# Patient Record
Sex: Female | Born: 1990 | Race: White | Hispanic: No | Smoking: Never smoker
Health system: Southern US, Community
[De-identification: ages and names within clinical notes are randomized; demographics above are authoritative.]

## PROBLEM LIST (undated history)

## (undated) DIAGNOSIS — E559 Vitamin D deficiency, unspecified: Secondary | ICD-10-CM

## (undated) DIAGNOSIS — K589 Irritable bowel syndrome without diarrhea: Secondary | ICD-10-CM

## (undated) DIAGNOSIS — F32A Depression, unspecified: Secondary | ICD-10-CM

## (undated) DIAGNOSIS — O24419 Gestational diabetes mellitus in pregnancy, unspecified control: Secondary | ICD-10-CM

## (undated) DIAGNOSIS — D649 Anemia, unspecified: Secondary | ICD-10-CM

## (undated) DIAGNOSIS — I10 Essential (primary) hypertension: Secondary | ICD-10-CM

## (undated) DIAGNOSIS — E282 Polycystic ovarian syndrome: Secondary | ICD-10-CM

## (undated) DIAGNOSIS — Z803 Family history of malignant neoplasm of breast: Secondary | ICD-10-CM

## (undated) DIAGNOSIS — E538 Deficiency of other specified B group vitamins: Secondary | ICD-10-CM

## (undated) DIAGNOSIS — Z8742 Personal history of other diseases of the female genital tract: Secondary | ICD-10-CM

## (undated) HISTORY — DX: Anemia, unspecified: D64.9

## (undated) HISTORY — DX: Gestational diabetes mellitus in pregnancy, unspecified control: O24.419

## (undated) HISTORY — DX: Polycystic ovarian syndrome: E28.2

## (undated) HISTORY — PX: TONSILLECTOMY AND ADENOIDECTOMY: SHX28

## (undated) HISTORY — DX: Family history of malignant neoplasm of breast: Z80.3

## (undated) HISTORY — DX: Vitamin D deficiency, unspecified: E55.9

## (undated) HISTORY — DX: Deficiency of other specified B group vitamins: E53.8

## (undated) HISTORY — DX: Irritable bowel syndrome, unspecified: K58.9

## (undated) HISTORY — DX: Personal history of other diseases of the female genital tract: Z87.42

## (undated) HISTORY — PX: WISDOM TOOTH EXTRACTION: SHX21

## (undated) HISTORY — DX: Depression, unspecified: F32.A

---

## 2017-12-23 DIAGNOSIS — L409 Psoriasis, unspecified: Secondary | ICD-10-CM | POA: Insufficient documentation

## 2019-01-10 DIAGNOSIS — Z2089 Contact with and (suspected) exposure to other communicable diseases: Secondary | ICD-10-CM | POA: Diagnosis not present

## 2019-01-10 DIAGNOSIS — J069 Acute upper respiratory infection, unspecified: Secondary | ICD-10-CM | POA: Diagnosis not present

## 2019-07-20 ENCOUNTER — Other Ambulatory Visit: Payer: Self-pay

## 2019-07-20 ENCOUNTER — Other Ambulatory Visit (HOSPITAL_COMMUNITY)
Admission: RE | Admit: 2019-07-20 | Discharge: 2019-07-20 | Disposition: A | Discharge status: Home / Self Care | Payer: BC Managed Care – PPO | Source: Ambulatory Visit | Attending: Obstetrics and Gynecology | Admitting: Obstetrics and Gynecology

## 2019-07-20 ENCOUNTER — Ambulatory Visit (INDEPENDENT_AMBULATORY_CARE_PROVIDER_SITE_OTHER): Payer: BC Managed Care – PPO | Admitting: Obstetrics and Gynecology

## 2019-07-20 ENCOUNTER — Encounter: Payer: Self-pay | Admitting: Obstetrics and Gynecology

## 2019-07-20 VITALS — BP 114/70 | HR 68 | Ht 66.0 in | Wt 209.0 lb

## 2019-07-20 DIAGNOSIS — E282 Polycystic ovarian syndrome: Secondary | ICD-10-CM | POA: Diagnosis not present

## 2019-07-20 DIAGNOSIS — Z0000 Encounter for general adult medical examination without abnormal findings: Secondary | ICD-10-CM

## 2019-07-20 DIAGNOSIS — Z124 Encounter for screening for malignant neoplasm of cervix: Secondary | ICD-10-CM | POA: Insufficient documentation

## 2019-07-20 DIAGNOSIS — Z01419 Encounter for gynecological examination (general) (routine) without abnormal findings: Secondary | ICD-10-CM

## 2019-07-20 DIAGNOSIS — R102 Pelvic and perineal pain: Secondary | ICD-10-CM | POA: Diagnosis not present

## 2019-07-20 DIAGNOSIS — F419 Anxiety disorder, unspecified: Secondary | ICD-10-CM

## 2019-07-20 DIAGNOSIS — F329 Major depressive disorder, single episode, unspecified: Secondary | ICD-10-CM

## 2019-07-20 DIAGNOSIS — Z30431 Encounter for routine checking of intrauterine contraceptive device: Secondary | ICD-10-CM

## 2019-07-20 DIAGNOSIS — Z13220 Encounter for screening for lipoid disorders: Secondary | ICD-10-CM

## 2019-07-20 DIAGNOSIS — Z975 Presence of (intrauterine) contraceptive device: Secondary | ICD-10-CM

## 2019-07-20 DIAGNOSIS — N921 Excessive and frequent menstruation with irregular cycle: Secondary | ICD-10-CM

## 2019-07-20 DIAGNOSIS — Z1329 Encounter for screening for other suspected endocrine disorder: Secondary | ICD-10-CM

## 2019-07-20 DIAGNOSIS — Z30011 Encounter for initial prescription of contraceptive pills: Secondary | ICD-10-CM

## 2019-07-20 DIAGNOSIS — Z130 Encounter for screening for diseases of the blood and blood-forming organs and certain disorders involving the immune mechanism: Secondary | ICD-10-CM

## 2019-07-20 DIAGNOSIS — Z131 Encounter for screening for diabetes mellitus: Secondary | ICD-10-CM

## 2019-07-20 MED ORDER — NORETHIN ACE-ETH ESTRAD-FE 1-20 MG-MCG PO TABS: 1.0000 | ORAL_TABLET | Freq: Every day | ORAL | 11 refills | Status: DC

## 2019-07-20 MED ORDER — ALPRAZOLAM 0.5 MG PO TABS: 0.5000 mg | ORAL_TABLET | Freq: Every evening | ORAL | 5 refills | Status: DC | PRN

## 2019-07-20 MED ORDER — ESCITALOPRAM OXALATE 10 MG PO TABS: 10.0000 mg | ORAL_TABLET | Freq: Every day | ORAL | 3 refills | Status: DC

## 2019-07-20 NOTE — Progress Notes (Signed)
Gynecology Annual Exam   PCP: Patient, No Pcp Per  Chief Complaint:  Chief Complaint  Patient presents with  . Gynecologic Exam    Having pelvic pain all the time.     History of Present Illness: Patient is a 28 y.o. G0P0000 presents for annual exam. The patient has additional complaints today of anxiety, depression, pelvic pain, irregular bleeding, and hx of PCOS  Gynecological History Menarche: 11 LMP: 07/17/2019 Describes periods as irregular. She some times has a week and a half of bleeding every 2 weeks.  Last pap smear: 3 years ago, unsure results, no records available from [place] History of STDs: None Sexually Active: Yes, has some dyspareunia or sharp pain which is positional. Also reports low desire and low lubrication  Obstetrical History G0P0  LMP: Patient's last menstrual period was 07/17/2019 (exact date). Average Interval: irregular, Duration of flow: 10 days Heavy Menses: less with IUD Clots: no Intermenstrual Bleeding: yes Postcoital Bleeding: no Dysmenorrhea: yes  The patient is sexually active. She currently uses IUD for contraception. She has dyspareunia.  The patient does perform self breast exams.  There is notable family history of breast or ovarian cancer in her family.  The patient wears seatbelts: yes.   The patient has regular exercise: no.    The patient reports current symptoms of depression.    Review of Systems: ROS  Past Medical History:  Past Medical History:  Diagnosis Date  . PCOS (polycystic ovarian syndrome)   . Vitamin B12 deficiency   . Vitamin D deficiency     Past Surgical History:  History reviewed. No pertinent surgical history.  Gynecologic History:  Patient's last menstrual period was 07/17/2019 (exact date). Contraception: IUD Last Pap: Results were: unknown   Obstetric History: G0P0000  Family History:  History reviewed. No pertinent family history.  Social History:  Social History   Socioeconomic  History  . Marital status: Married    Spouse name: Not on file  . Number of children: Not on file  . Years of education: Not on file  . Highest education level: Not on file  Occupational History  . Not on file  Social Needs  . Financial resource strain: Not on file  . Food insecurity    Worry: Not on file    Inability: Not on file  . Transportation needs    Medical: Not on file    Non-medical: Not on file  Tobacco Use  . Smoking status: Never Smoker  . Smokeless tobacco: Never Used  Substance and Sexual Activity  . Alcohol use: Yes  . Drug use: Never  . Sexual activity: Yes    Birth control/protection: I.U.D.  Lifestyle  . Physical activity    Days per week: Not on file    Minutes per session: Not on file  . Stress: Not on file  Relationships  . Social Herbalist on phone: Not on file    Gets together: Not on file    Attends religious service: Not on file    Active member of club or organization: Not on file    Attends meetings of clubs or organizations: Not on file    Relationship status: Not on file  . Intimate partner violence    Fear of current or ex partner: Not on file    Emotionally abused: Not on file    Physically abused: Not on file    Forced sexual activity: Not on file  Other Topics Concern  .  Not on file  Social History Narrative  . Not on file    Allergies:  Allergies  Allergen Reactions  . Augmentin [Amoxicillin-Pot Clavulanate] Diarrhea and Nausea And Vomiting    Medications: Prior to Admission medications   Not on File    Physical Exam Vitals: Blood pressure 114/70, pulse 68, height 5\' 6"  (1.676 m), weight 209 lb (94.8 kg), last menstrual period 07/17/2019.  General: NAD HEENT: normocephalic, anicteric Thyroid: no enlargement, no palpable nodules Pulmonary: No increased work of breathing, CTAB Cardiovascular: RRR, distal pulses 2+ Breast: Breast symmetrical, no tenderness, no palpable nodules or masses, no skin or nipple  retraction present, no nipple discharge.  No axillary or supraclavicular lymphadenopathy. Abdomen: NABS, soft, non-tender, non-distended.  Umbilicus without lesions.  No hepatomegaly, splenomegaly or masses palpable. No evidence of hernia  Genitourinary:  External: Normal external female genitalia.  Normal urethral meatus, normal Bartholin's and Skene's glands.    Vagina: Normal vaginal mucosa, no evidence of prolapse.    Cervix: Grossly normal in appearance, no bleeding  Uterus: Non-enlarged, mobile, normal contour.  No CMT  Adnexa: ovaries non-enlarged, no adnexal masses  Rectal: deferred  Lymphatic: no evidence of inguinal lymphadenopathy Extremities: no edema, erythema, or tenderness Neurologic: Grossly intact Psychiatric: mood appropriate, affect full  Female chaperone present for pelvic and breast  portions of the physical exam    Assessment: 28 y.o. G0P0000 routine annual exam  Plan: Problem List Items Addressed This Visit    None    Visit Diagnoses    Health care maintenance    -  Primary   Relevant Orders   CBC   TSH + free T4   Hemoglobin A1c   Lipid panel   IUD check up       Relevant Orders   US PELVIS TRANSVAGINAL NON-OB (TV ONLY)   Pelvic pain       Relevant Orders   US PELVIS TRANSVAGINAL NON-OB (TV ONLY)   PCOS (polycystic ovarian syndrome)       Relevant Orders   Hemoglobin A1c   Lipid panel   Thyroid disorder screen       Relevant Orders   TSH + free T4   Screening cholesterol level       Relevant Orders   Lipid panel   Screening for diabetes mellitus       Relevant Orders   Hemoglobin A1c   Screening for deficiency anemia       Relevant Orders   CBC   Encounter for initial prescription of contraceptive pills       Relevant Medications   norethindrone-ethinyl estradiol (JUNEL FE 1/20) 1-20 MG-MCG tablet   Breakthrough bleeding with IUD       Cervical cancer screening       Relevant Orders   Cytology - PAP   Anxiety and depression        Relevant Medications   escitalopram (LEXAPRO) 10 MG tablet   ALPRAZolam (XANAX) 0.5 MG tablet      2) STI screening  was offered and accepted  2)  ASCCP guidelines and rational discussed.  Patient opts for every 3 years screening interval  3) Contraception - the patient is currently using  IUD.  She is interested in changing to an oral contraceptive pill. She will start an OCP today and see if she is able to remember to take it every day.   4) Routine healthcare maintenance including cholesterol, diabetes screening discussed ordered today.  5) Offered Myrisk testing for family hx  of breast cancer, she is interested and will consider  6) Hx of PCOS, given information so she can learn more  7) Pelvic pain- will follow up for [place] to access pelvis and IUD position  8) Will need to discuss dyspareunia more at next visit  9) Anxiety and depression. She has taken prozac and celexa in the past and did not like these medications. She is willing to try lexapro, reports her mom takes this medication and is happy with it.  She has not been sleeping at night, frequent awakenings and nightmares. Will start xanax before bed.  GAD-7: 12 PHQ-9: 16  10)Counseled on healthy eating and exercise  11) Return in about 4 weeks (around 08/17/2019) for GYN visit and [place].  Adelene Idler MD [facility], [facility] 07/20/2019 5:04 PM

## 2019-07-20 NOTE — Patient Instructions (Signed)
Institute of [place] for Calcium and Vitamin D  Age (yr) Calcium Recommended Dietary Allowance (mg/day) Vitamin D Recommended Dietary Allowance (international units/day)  9-18 1,300 600  19-50 1,000 600  51-70 1,200 600  71 and older 1,200 800  Data from Institute of Medicine. Dietary reference intakes: calcium, vitamin D. [place], [place]: [employer]; 2011.    Woman[employer]: Perfect Your Cycle, Amplify Your Fertility, Supercharge Your Sex Drive, and Become a Power Source   Diet for Polycystic Ovary Syndrome Polycystic ovary syndrome (PCOS) is a disorder of the chemicals (hormones) that regulate a woman's reproductive system, including monthly periods (menstruation). The condition causes important hormones to be out of balance. PCOS can:  Stop your periods or make them irregular.  Cause cysts to develop on your ovaries.  Make it difficult to get pregnant.  Stop your body from responding to the effects of insulin (insulin resistance). Insulin resistance can lead to obesity and diabetes. Changing what you eat can help you manage PCOS and improve your health. Following a balanced diet can help you lose weight and improve the way that your body uses insulin. What are tips for following this plan?  Follow a balanced diet for meals and snacks. Eat breakfast, lunch, dinner, and one or two snacks every day.  Include protein in each meal and snack.  Choose whole grains instead of products that are made with refined flour.  Eat a variety of foods.  Exercise regularly as told by your health care provider. Aim to do 30 or more minutes of exercise on most days of the week.  If you are overweight or obese: ? Pay attention to how many calories you eat. Cutting down on calories can help you lose weight. ? Work with your health care provider or a diet and nutrition specialist (dietitian) to figure out how many calories you need each day. What  foods can I eat?  Fruits Include a variety of colors and types. All fruits are helpful for PCOS. Vegetables Include a variety of colors and types. All vegetables are helpful for PCOS. Grains Whole grains, such as whole wheat. Whole-grain breads, crackers, cereals, and pasta. Unsweetened oatmeal, bulgur, barley, quinoa, and brown rice. Tortillas made from corn or whole-wheat flour. Meats and other proteins Low-fat (lean) proteins, such as fish, chicken, beans, eggs, and tofu. Dairy Low-fat dairy products, such as skim milk, cheese sticks, and yogurt. Beverages Low-fat or fat-free drinks, such as water, low-fat milk, sugar-free drinks, and small amounts of 100% fruit juice. Seasonings and condiments Ketchup. Mustard. Barbecue sauce. Relish. Low-fat or fat-free mayonnaise. Fats and oils Olive oil or canola oil. Walnuts and almonds. The items listed above may not be a complete list of recommended foods and beverages. Contact a dietitian for more options. What foods are not recommended? Foods that are high in calories or fat. Fried foods. Sweets. Products that are made from refined white flour, including white bread, pastries, white rice, and pasta. The items listed above may not be a complete list of foods and beverages to avoid. Contact a dietitian for more information. Summary  PCOS is a hormonal imbalance that affects a woman's reproductive system.  You can help to manage your PCOS by exercising regularly and eating a healthy, varied diet of vegetables, fruit, whole grains, low-fat (lean) protein, and low-fat dairy products.  Changing what you eat can improve the way that your body uses insulin, help your hormones reach normal levels, and help you lose weight. This information  is not intended to replace advice given to you by your health care provider. Make sure you discuss any questions you have with your health care provider. Document Released: 02/05/2016 Document Revised: 02/03/2019  Document Reviewed: 08/18/2017 Elsevier Patient Education  2020 Elsevier Inc.  Polycystic Ovarian Syndrome  Polycystic ovarian syndrome (PCOS) is a common hormonal disorder among women of reproductive age. In most women with PCOS, many small fluid-filled sacs (cysts) grow on the ovaries, and the cysts are not part of a normal menstrual cycle. PCOS can cause problems with your menstrual periods and make it difficult to get pregnant. It can also cause an increased risk of miscarriage with pregnancy. If it is not treated, PCOS can lead to serious health problems, such as diabetes and heart disease. What are the causes? The cause of PCOS is not known, but it may be the result of a combination of certain factors, such as:  Irregular menstrual cycle.  High levels of certain hormones (androgens).  Problems with the hormone that helps to control blood sugar (insulin resistance).  Certain genes. What increases the risk? This condition is more likely to develop in women who have a family history of PCOS. What are the signs or symptoms? Symptoms of PCOS may include:  Multiple ovarian cysts.  Infrequent periods or no periods.  Periods that are too frequent or too heavy.  Unpredictable periods.  Inability to get pregnant (infertility) because of not ovulating.  Increased growth of hair on the face, chest, stomach, back, thumbs, thighs, or toes.  Acne or oily skin. Acne may develop during adulthood, and it may not respond to treatment.  Pelvic pain.  Weight gain or obesity.  Patches of thickened and dark brown or black skin on the neck, arms, breasts, or thighs (acanthosis nigricans).  Excess hair growth on the face, chest, abdomen, or upper thighs (hirsutism). How is this diagnosed? This condition is diagnosed based on:  Your medical history.  A physical exam, including a pelvic exam. Your health care provider may look for areas of increased hair growth on your skin.  Tests, such  as: ? Ultrasound. This may be used to examine the ovaries and the lining of the uterus (endometrium) for cysts. ? Blood tests. These may be used to check levels of sugar (glucose), female hormone (testosterone), and female hormones (estrogen and progesterone) in your blood. How is this treated? There is no cure for PCOS, but treatment can help to manage symptoms and prevent more health problems from developing. Treatment varies depending on:  Your symptoms.  Whether you want to have a baby or whether you need birth control (contraception). Treatment may include nutrition and lifestyle changes along with:  Progesterone hormone to start a menstrual period.  Birth control pills to help you have regular menstrual periods.  Medicines to make you ovulate, if you want to get pregnant.  Medicine to reduce excessive hair growth.  Surgery, in severe cases. This may involve making small holes in one or both of your ovaries. This decreases the amount of testosterone that your body produces. Follow these instructions at home:  Take over-the-counter and prescription medicines only as told by your health care provider.  Follow a healthy meal plan. This can help you reduce the effects of PCOS. ? Eat a healthy diet that includes lean proteins, complex carbohydrates, fresh fruits and vegetables, low-fat dairy products, and healthy fats. Make sure to eat enough fiber.  If you are overweight, lose weight as told by your health care  provider. ? Losing 10% of your body weight may improve symptoms. ? Your health care provider can determine how much weight loss is best for you and can help you lose weight safely.  Keep all follow-up visits as told by your health care provider. This is important. Contact a health care provider if:  Your symptoms do not get better with medicine.  You develop new symptoms. This information is not intended to replace advice given to you by your health care provider. Make sure  you discuss any questions you have with your health care provider. Document Released: 02/07/2005 Document Revised: 09/26/2017 Document Reviewed: 03/31/2016 Elsevier Patient Education  2020 [employer].

## 2019-07-21 LAB — CBC
Hematocrit: 42.1 % (ref 34.0–46.6)
Hemoglobin: 14.6 g/dL (ref 11.1–15.9)
MCH: 32.3 pg (ref 26.6–33.0)
MCHC: 34.7 g/dL (ref 31.5–35.7)
MCV: 93 fL (ref 79–97)
Platelets: 225 10*3/uL (ref 150–450)
RBC: 4.52 x10E6/uL (ref 3.77–5.28)
RDW: 11.7 % (ref 11.7–15.4)
WBC: 5.6 10*3/uL (ref 3.4–10.8)

## 2019-07-21 LAB — HEMOGLOBIN A1C
Est. average glucose Bld gHb Est-mCnc: 94 mg/dL
Hgb A1c MFr Bld: 4.9 % (ref 4.8–5.6)

## 2019-07-21 LAB — TSH+FREE T4
Free T4: 1.28 ng/dL (ref 0.82–1.77)
TSH: 2.37 u[IU]/mL (ref 0.450–4.500)

## 2019-07-21 LAB — LIPID PANEL
Chol/HDL Ratio: 3.2 ratio (ref 0.0–4.4)
Cholesterol, Total: 153 mg/dL (ref 100–199)
HDL: 48 mg/dL (ref >39)
LDL Chol Calc (NIH): 91 mg/dL (ref 0–99)
Triglycerides: 70 mg/dL (ref 0–149)
VLDL Cholesterol Cal: 14 mg/dL (ref 5–40)

## 2019-07-21 NOTE — Progress Notes (Signed)
Called and discussed with patient

## 2019-07-29 DIAGNOSIS — Z1371 Encounter for nonprocreative screening for genetic disease carrier status: Secondary | ICD-10-CM

## 2019-07-29 DIAGNOSIS — Z9189 Other specified personal risk factors, not elsewhere classified: Secondary | ICD-10-CM

## 2019-07-29 HISTORY — DX: Encounter for nonprocreative screening for genetic disease carrier status: Z13.71

## 2019-07-29 HISTORY — DX: Other specified personal risk factors, not elsewhere classified: Z91.89

## 2019-08-17 ENCOUNTER — Encounter: Payer: Self-pay | Admitting: Obstetrics and Gynecology

## 2019-08-17 ENCOUNTER — Ambulatory Visit (INDEPENDENT_AMBULATORY_CARE_PROVIDER_SITE_OTHER): Payer: BC Managed Care – PPO | Admitting: Obstetrics and Gynecology

## 2019-08-17 ENCOUNTER — Ambulatory Visit (INDEPENDENT_AMBULATORY_CARE_PROVIDER_SITE_OTHER): Payer: BC Managed Care – PPO

## 2019-08-17 ENCOUNTER — Other Ambulatory Visit: Payer: Self-pay

## 2019-08-17 VITALS — BP 120/80 | Ht 66.0 in | Wt 209.0 lb

## 2019-08-17 DIAGNOSIS — F419 Anxiety disorder, unspecified: Secondary | ICD-10-CM

## 2019-08-17 DIAGNOSIS — Z30432 Encounter for removal of intrauterine contraceptive device: Secondary | ICD-10-CM | POA: Diagnosis not present

## 2019-08-17 DIAGNOSIS — R102 Pelvic and perineal pain: Secondary | ICD-10-CM

## 2019-08-17 DIAGNOSIS — F329 Major depressive disorder, single episode, unspecified: Secondary | ICD-10-CM

## 2019-08-17 DIAGNOSIS — Z30431 Encounter for routine checking of intrauterine contraceptive device: Secondary | ICD-10-CM | POA: Diagnosis not present

## 2019-08-17 NOTE — Progress Notes (Signed)
Patient ID: Audrey Clarke, female   DOB: October 06, 1991, 28 y.o.   MRN: [id number]  Reason for Consult: Follow-up   Referred by No ref. provider found  Subjective:     HPI:  Audrey Clarke is a 28 y.o. female. She presents today for follow up on OCP, IUD, [place], depression and myrisk testing.  She is happy and feels like her depression and anxiety medication is working well.  She was able to take the OCP daily. She has some spotting, not a full period with the medication.  She would like to have the IUD removed because it has caused her some intermittent pain and pain with intercourse.  She would like to do myrisk testing today.   Past Medical History:  Diagnosis Date  . PCOS (polycystic ovarian syndrome)   . Vitamin B12 deficiency   . Vitamin D deficiency    Family History  Problem Relation Age of Onset  . Breast cancer Maternal Aunt 54  . Breast cancer Maternal Grandmother 60   History reviewed. No pertinent surgical history.  Short Social History:  Social History   Tobacco Use  . Smoking status: Never Smoker  . Smokeless tobacco: Never Used  Substance Use Topics  . Alcohol use: Yes    Allergies  Allergen Reactions  . Augmentin [Amoxicillin-Pot Clavulanate] Diarrhea and Nausea And Vomiting    Current Outpatient Medications  Medication Sig Dispense Refill  . ALPRAZolam (XANAX) 0.5 MG tablet Take 1 tablet (0.5 mg total) by mouth at bedtime as needed for anxiety. 30 tablet 5  . escitalopram (LEXAPRO) 10 MG tablet Take 1 tablet (10 mg total) by mouth daily. 30 tablet 3  . levonorgestrel (MIRENA) 20 MCG/24HR IUD 1 each by Intrauterine route once.    . norethindrone-ethinyl estradiol (JUNEL FE 1/20) 1-20 MG-MCG tablet Take 1 tablet by mouth daily. 1 Package 11   No current facility-administered medications for this visit.     Review of Systems  Constitutional: Negative for chills, fatigue, fever and unexpected weight change.  HENT: Negative for trouble swallowing.   Eyes: Negative for loss of vision.  Respiratory: Negative for cough, shortness of breath and wheezing.  Cardiovascular: Negative for chest pain, leg swelling, palpitations and syncope.  GI: Negative for abdominal pain, blood in stool, diarrhea, nausea and vomiting.  GU: Negative for difficulty urinating, dysuria, frequency and hematuria.  Musculoskeletal: Negative for back pain, leg pain and joint pain.  Skin: Negative for rash.  Neurological: Negative for dizziness, headaches, light-headedness, numbness and seizures.  Psychiatric: Negative for behavioral problem, confusion, depressed mood and sleep disturbance.        Objective:  Objective   Vitals:   08/17/19 1040  BP: 120/80  Weight: 209 lb (94.8 kg)  Height: 5\' 6"  (1.676 m)   Body mass index is 33.73 kg/m.  Physical Exam Vitals signs and nursing note reviewed.  Constitutional:      Appearance: She is well-developed.  HENT:     Head: Normocephalic and atraumatic.  Eyes:     Pupils: Pupils are equal, round, and reactive to light.  Cardiovascular:     Rate and Rhythm: Normal rate and regular rhythm.  Pulmonary:     Effort: Pulmonary effort is normal. No respiratory distress.  Skin:    General: Skin is warm and dry.  Neurological:     Mental Status: She is alert and oriented to person, place, and time.  Psychiatric:        Behavior: Behavior normal.  Thought Content: Thought content normal.        Judgment: Judgment normal.    IUD Removal Strings of IUD identified and grasped.  IUD removed without problem.  Pt tolerated this well.  IUD noted to be intact.       Assessment/Plan:     28 yo  1. IUD removed - will continue with OCP. Will follow up in 3 months I bleeding pattern and pain not improeved 2. Myrisk testing today for family hx breast cancer 3. Anxiety and depression impreved with lexapro and xanax per patient  More than 15 minutes were spent face to face with the patient in the room with more  than 50% of the time spent providing counseling and discussing the plan of management.     Adelene Idler MD [facility], [facility] 08/17/2019 11:18 AM

## 2019-08-30 ENCOUNTER — Encounter: Payer: Self-pay | Admitting: Obstetrics and Gynecology

## 2019-09-13 ENCOUNTER — Other Ambulatory Visit: Payer: Self-pay | Admitting: Obstetrics and Gynecology

## 2019-09-13 DIAGNOSIS — F329 Major depressive disorder, single episode, unspecified: Secondary | ICD-10-CM

## 2019-09-13 DIAGNOSIS — F32A Depression, unspecified: Secondary | ICD-10-CM

## 2019-09-13 NOTE — Telephone Encounter (Signed)
Please advise 

## 2019-09-13 NOTE — Telephone Encounter (Signed)
Okay to refill? 

## 2019-09-20 ENCOUNTER — Telehealth: Payer: Self-pay

## 2019-09-20 NOTE — Telephone Encounter (Signed)
Pt states she is returning a call from [facility]

## 2019-09-20 NOTE — Telephone Encounter (Signed)
Called back- did not answer- lets set her up with a telephone appointment when she calls back so I can discuss her myrisk result with her.  Thank you,  Dr. Gilman Schmidt

## 2019-09-20 NOTE — Telephone Encounter (Signed)
Audrey Clarke, please see Schuman's message regarding pt needing phone visit

## 2019-09-21 NOTE — Telephone Encounter (Signed)
Called and left voice mail for patient to call back to be schedule °

## 2019-09-27 NOTE — Telephone Encounter (Signed)
Called and left voice mail for patient to call back to be schedule °

## 2019-09-27 NOTE — Telephone Encounter (Signed)
Patient reports already spoke with Dr. Gilman Schmidt about results

## 2019-10-11 DIAGNOSIS — B028 Zoster with other complications: Secondary | ICD-10-CM | POA: Diagnosis not present

## 2019-10-25 DIAGNOSIS — G43709 Chronic migraine without aura, not intractable, without status migrainosus: Secondary | ICD-10-CM | POA: Insufficient documentation

## 2019-10-25 DIAGNOSIS — K581 Irritable bowel syndrome with constipation: Secondary | ICD-10-CM | POA: Diagnosis not present

## 2019-10-25 DIAGNOSIS — Z7689 Persons encountering health services in other specified circumstances: Secondary | ICD-10-CM | POA: Diagnosis not present

## 2019-10-25 DIAGNOSIS — Z0000 Encounter for general adult medical examination without abnormal findings: Secondary | ICD-10-CM | POA: Diagnosis not present

## 2019-10-25 DIAGNOSIS — F418 Other specified anxiety disorders: Secondary | ICD-10-CM | POA: Insufficient documentation

## 2020-12-28 ENCOUNTER — Other Ambulatory Visit: Payer: Self-pay

## 2020-12-28 ENCOUNTER — Encounter: Payer: Self-pay | Admitting: Obstetrics and Gynecology

## 2020-12-28 ENCOUNTER — Other Ambulatory Visit (HOSPITAL_COMMUNITY)
Admission: RE | Admit: 2020-12-28 | Discharge: 2020-12-28 | Disposition: A | Discharge status: Home / Self Care | Payer: Self-pay | Source: Ambulatory Visit | Attending: Obstetrics and Gynecology | Admitting: Obstetrics and Gynecology

## 2020-12-28 ENCOUNTER — Ambulatory Visit (INDEPENDENT_AMBULATORY_CARE_PROVIDER_SITE_OTHER): Payer: BC Managed Care – PPO | Admitting: Obstetrics and Gynecology

## 2020-12-28 VITALS — BP 120/70 | Ht 66.0 in | Wt 229.6 lb

## 2020-12-28 DIAGNOSIS — Z1231 Encounter for screening mammogram for malignant neoplasm of breast: Secondary | ICD-10-CM | POA: Diagnosis not present

## 2020-12-28 DIAGNOSIS — Z124 Encounter for screening for malignant neoplasm of cervix: Secondary | ICD-10-CM | POA: Diagnosis not present

## 2020-12-28 DIAGNOSIS — F32A Depression, unspecified: Secondary | ICD-10-CM

## 2020-12-28 DIAGNOSIS — Z3041 Encounter for surveillance of contraceptive pills: Secondary | ICD-10-CM

## 2020-12-28 DIAGNOSIS — Z01419 Encounter for gynecological examination (general) (routine) without abnormal findings: Secondary | ICD-10-CM | POA: Diagnosis not present

## 2020-12-28 DIAGNOSIS — Z0000 Encounter for general adult medical examination without abnormal findings: Secondary | ICD-10-CM | POA: Diagnosis not present

## 2020-12-28 DIAGNOSIS — F419 Anxiety disorder, unspecified: Secondary | ICD-10-CM

## 2020-12-28 MED ORDER — SERTRALINE HCL 50 MG PO TABS: 50.0000 mg | ORAL_TABLET | Freq: Every day | ORAL | 2 refills | Status: DC

## 2020-12-28 NOTE — Progress Notes (Signed)
Gynecology Annual Exam  PCP: Patient, No Pcp Per  Chief Complaint:  Chief Complaint  Patient presents with  . Gynecologic Exam    History of Present Illness: Patient is a 30 y.o. G0P0000 presents for annual exam. The patient has no complaints today.   LMP: Patient's last menstrual period was 12/17/2020. Average Interval: 27 days Duration of flow: 4 days Heavy Menses: yes Intermenstrual Bleeding: no Dysmenorrhea: no  The patient does perform self breast exams.  There is notable family history of breast or ovarian cancer in her family. She is at an incread lifetime risk of breast cancer.   The patient reports her exercise generally consists of walking/ gym 3 days a week.  The patient reports current symptoms of depression.   PHQ-9: 16 GAD-7: 16   Patient reports that she stopped taking her oral birth control pill and June 2020 201.  She has been trying to conceive.  Although she reports that her and her partner have had a "relaxed" approach to trying to get pregnant. She does report that she has been taking it prenatal vitamin.  She despite having a relaxed approach does find this process stressful.  She also reports that she has been having increased difficulty with anxiety depression.  In the past I had prescribed her Lexapro and Xanax.  The patient discontinued these medications because she reported the Lexapro caused migraines and headaches for her.  She reports that her primary care physician tried her on a Effexor which did improve her headaches however she weaned off this medication about 8 months ago.  She reports that she took the Effexor for about 5 to 6 months.  She reports that after she discontinued the Effexor she lost her job and found a new job that has been very stressful.  She feels like her mood has been worse and she is currently in the process of looking for a new job.  She is interested in starting a new antidepressant medication to help her feel  better.  Review of Systems: Review of Systems  Constitutional: Negative for chills, fever, malaise/fatigue and weight loss.  HENT: Negative for congestion, hearing loss and sinus pain.   Eyes: Negative for blurred vision and double vision.  Respiratory: Negative for cough, sputum production, shortness of breath and wheezing.   Cardiovascular: Negative for chest pain, palpitations, orthopnea and leg swelling.  Gastrointestinal: Negative for abdominal pain, constipation, diarrhea, nausea and vomiting.  Genitourinary: Negative for dysuria, flank pain, frequency, hematuria and urgency.  Musculoskeletal: Negative for back pain, falls and joint pain.  Skin: Negative for itching and rash.  Neurological: Negative for dizziness and headaches.  Psychiatric/Behavioral: Positive for depression. Negative for substance abuse and suicidal ideas. The patient is nervous/anxious.     Past Medical History:  Past Medical History:  Diagnosis Date  . BRCA negative 07/2019   MyRisk neg  . Family history of breast cancer   . Increased risk of breast cancer 07/2019   IBIS=27.7%/riskscore=22.9%  . PCOS (polycystic ovarian syndrome)   . Vitamin B12 deficiency   . Vitamin D deficiency     Past Surgical History:  History reviewed. No pertinent surgical history.  Gynecologic History:  Patient's last menstrual period was 12/17/2020. Menarche: 11  History of fibroids, polyps, or ovarian cysts? : yes: ovarian cysts  History of PCOS? Yes, 5-6 years ago in [place] Hstory of Endometriosis? no History of abnormal pap smears? no Have you had any sexually transmitted infections in the past? no  She has had HPV vaccination in the past.   Last Pap: Results were: unknown   She identifies as a female. She is sexually active with men.   She denies dyspareunia. She denies postcoital bleeding.  She currently uses none for contraception.    Obstetric History: G0P0000  Family History:  Family History   Problem Relation Age of Onset  . Breast cancer Maternal Aunt 49  . Breast cancer Maternal Grandmother 60    Social History:  Social History   Socioeconomic History  . Marital status: Married    Spouse name: Not on file  . Number of children: Not on file  . Years of education: Not on file  . Highest education level: Not on file  Occupational History  . Not on file  Tobacco Use  . Smoking status: Never Smoker  . Smokeless tobacco: Never Used  Vaping Use  . Vaping Use: Never used  Substance and Sexual Activity  . Alcohol use: Yes  . Drug use: Never  . Sexual activity: Yes  Other Topics Concern  . Not on file  Social History Narrative  . Not on file   Social Determinants of Health   Financial Resource Strain: Not on file  Food Insecurity: Not on file  Transportation Needs: Not on file  Physical Activity: Not on file  Stress: Not on file  Social Connections: Not on file  Intimate Partner Violence: Not on file    Allergies:  Allergies  Allergen Reactions  . Augmentin [Amoxicillin-Pot Clavulanate] Diarrhea and Nausea And Vomiting    Medications: Prior to Admission medications   Medication Sig Start Date End Date Taking? Authorizing Provider  ALPRAZolam Duanne Moron) 0.5 MG tablet Take 1 tablet (0.5 mg total) by mouth at bedtime as needed for anxiety. Patient not taking: Reported on 12/28/2020 07/20/19   Homero Fellers, MD  escitalopram (LEXAPRO) 10 MG tablet TAKE ONE TABLET BY MOUTH DAILY Patient not taking: Reported on 12/28/2020 09/13/19   Homero Fellers, MD  levonorgestrel (MIRENA) 20 MCG/24HR IUD 1 each by Intrauterine route once. Patient not taking: Reported on 12/28/2020    [provider]  norethindrone-ethinyl estradiol (JUNEL FE 1/20) 1-20 MG-MCG tablet Take 1 tablet by mouth daily. Patient not taking: Reported on 12/28/2020 07/20/19   Homero Fellers, MD    Physical Exam Vitals: Blood pressure 120/70, height _0  (1.676 m), weight 229 lb  9.6 oz (104.1 kg), last menstrual period 12/17/2020.  Physical Exam Constitutional:      Appearance: She is well-developed.  Genitourinary:     Genitourinary Comments: External: Normal appearing vulva. No lesions noted.  Speculum examination: Normal appearing cervix. No blood in the vaginal vault. no discharge.   Bimanual examination: Uterus midline, non-tender, normal in size, shape and contour.  No CMT. No adnexal masses. No adnexal tenderness. Pelvis not fixed.  Breast exam:  breast equal without skin changes, nipple discharge, breast lump or enlarged lymph nodes   HENT:     Head: Normocephalic and atraumatic.  Neck:     Thyroid: No thyromegaly.  Cardiovascular:     Rate and Rhythm: Normal rate and regular rhythm.     Heart sounds: Normal heart sounds.  Pulmonary:     Effort: Pulmonary effort is normal.     Breath sounds: Normal breath sounds.  Abdominal:     General: Bowel sounds are normal. There is no distension.     Palpations: Abdomen is soft. There is no mass.  Musculoskeletal:  Cervical back: Neck supple.  Neurological:     Mental Status: She is alert and oriented to person, place, and time.  Skin:    General: Skin is warm and dry.  Psychiatric:        Behavior: Behavior normal.        Thought Content: Thought content normal.        Judgment: Judgment normal.  Vitals reviewed.      Female chaperone present for pelvic and breast  portions of the physical exam  Assessment: 30 y.o. G0P0000 routine annual exam  Plan: Problem List Items Addressed This Visit   None   Visit Diagnoses    Health maintenance examination    -  Primary   Breast cancer screening by mammogram       Cervical cancer screening       Relevant Orders   Cytology - PAP   Encounter for gynecological examination without abnormal finding       Encounter for birth control pills maintenance       Anxiety and depression       Relevant Medications   sertraline (ZOLOFT) 50 MG tablet       1) STI screening was offered and declined  2) ASCCP guidelines and rational discussed.  Patient opts for every 3 years screening interval  3) Contraception - not desired, trying to conceive, discussed timing of intercourse.   4) Routine healthcare maintenance including cholesterol, diabetes screening discussed managed by PCP  5) Patient would like to start medication for anxiety and depression again- zoloft prescribed.  Adrian Prows MD, [facility], [facility] 12/28/2020 10:28 AM

## 2020-12-28 NOTE — Patient Instructions (Signed)
Dr. Caryn Section  [id number]      Institute of Medicine Recommended Dietary Allowances for Calcium and Vitamin D  Age (yr) Calcium Recommended Dietary Allowance (mg/day) Vitamin D Recommended Dietary Allowance (international units/day)  9-18 1,300 600  19-50 1,000 600  51-70 1,200 600  71 and older 1,200 800  Data from Institute of Medicine. Dietary reference intakes: calcium, vitamin D. [place], [place]: [employer]; 2011.    Exercising to Stay Healthy To become healthy and stay healthy, it is recommended that you do moderate-intensity and vigorous-intensity exercise. You can tell that you are exercising at a moderate intensity if your heart starts beating faster and you start breathing faster but can still hold a conversation. You can tell that you are exercising at a vigorous intensity if you are breathing much harder and faster and cannot hold a conversation while exercising. Exercising regularly is important. It has many health benefits, such as:  Improving overall fitness, flexibility, and endurance.  Increasing bone density.  Helping with weight control.  Decreasing body fat.  Increasing muscle strength.  Reducing stress and tension.  Improving overall health. How often should I exercise? Choose an activity that you enjoy, and set realistic goals. Your health care provider can help you make an activity plan that works for you. Exercise regularly as told by your health care provider. This may include:  Doing strength training two times a week, such as: ? Lifting weights. ? Using resistance bands. ? Push-ups. ? Sit-ups. ? Yoga.  Doing a certain intensity of exercise for a given amount of time. Choose from these options: ? A total of 150 minutes of moderate-intensity exercise every week. ? A total of 75 minutes of vigorous-intensity exercise every week. ? A mix of moderate-intensity and vigorous-intensity exercise every week. Children, pregnant  women, people who have not exercised regularly, people who are overweight, and older adults may need to talk with a health care provider about what activities are safe to do. If you have a medical condition, be sure to talk with your health care provider before you start a new exercise program. What are some exercise ideas? Moderate-intensity exercise ideas include:  Walking 1 mile (1.6 km) in about 15 minutes.  Biking.  Hiking.  Golfing.  Dancing.  Water aerobics. Vigorous-intensity exercise ideas include:  Walking 4.5 miles (7.2 km) or more in about 1 hour.  Jogging or running 5 miles (8 km) in about 1 hour.  Biking 10 miles (16.1 km) or more in about 1 hour.  Lap swimming.  Roller-skating or in-line skating.  Cross-country skiing.  Vigorous competitive sports, such as football, basketball, and soccer.  Jumping rope.  Aerobic dancing.   What are some everyday activities that can help me to get exercise?  Yard work, such as: ? Pushing a Surveyor, mining. ? Raking and bagging leaves.  Washing your car.  Pushing a stroller.  Shoveling snow.  Gardening.  Washing windows or floors. How can I be more active in my day-to-day activities?  Use stairs instead of an elevator.  Take a walk during your lunch break.  If you drive, park your car farther away from your work or school.  If you take public transportation, get off one stop early and walk the rest of the way.  Stand up or walk around during all of your indoor phone calls.  Get up, stretch, and walk around every 30 minutes throughout the day.  Enjoy exercise with a friend. Support to continue exercising will  help you keep a regular routine of activity. What guidelines can I follow while exercising?  Before you start a new exercise program, talk with your health care provider.  Do not exercise so much that you hurt yourself, feel dizzy, or get very short of breath.  Wear comfortable clothes and wear shoes  with good support.  Drink plenty of water while you exercise to prevent dehydration or heat stroke.  Work out until your breathing and your heartbeat get faster. Where to find more information  [place] Department of Health and Human Services: ThisPath.fi  Centers for Disease Control and Prevention (CDC): FootballExhibition.com.br Summary  Exercising regularly is important. It will improve your overall fitness, flexibility, and endurance.  Regular exercise also will improve your overall health. It can help you control your weight, reduce stress, and improve your bone density.  Do not exercise so much that you hurt yourself, feel dizzy, or get very short of breath.  Before you start a new exercise program, talk with your health care provider. This information is not intended to replace advice given to you by your health care provider. Make sure you discuss any questions you have with your health care provider. Document Revised: 09/26/2017 Document Reviewed: 09/04/2017 Elsevier Patient Education  2021 Elsevier Inc.   Budget-Friendly Healthy Eating There are many ways to save money at the grocery store and continue to eat healthy. You can be successful if you:  Plan meals according to your budget.  Make a grocery list and only purchase food according to your grocery list.  Prepare food yourself at home. What are tips for following this plan? Reading food labels  Compare food labels between brand name foods and the store brand. Often the nutritional value is the same, but the store brand is lower cost.  Look for products that do not have added sugar, fat, or salt (sodium). These often cost the same but are healthier for you. Products may be labeled as: ? Sugar-free. ? Nonfat. ? Low-fat. ? Sodium-free. ? Low-sodium.  Look for lean ground beef labeled as at least 92% lean and 8% fat. Shopping  Buy only the items on your grocery list and go only to the areas of the store that have the items on  your list.  Use coupons only for foods and brands you normally buy. Avoid buying items you wouldn't normally buy simply because they are on sale.  Check online and in newspapers for weekly deals.  Buy healthy items from the bulk bins when available, such as herbs, spices, flour, pasta, nuts, and dried fruit.  Buy fruits and vegetables that are in season. Prices are usually lower on in-season produce.  Look at the unit price on the price tag. Use it to compare different brands and sizes to find out which item is the best deal.  Choose healthy items that are often low-cost, such as carrots, potatoes, apples, bananas, and oranges. Dried or canned beans are a low-cost protein source.  Buy in bulk and freeze extra food. Items you can buy in bulk include meats, fish, poultry, frozen fruits, and frozen vegetables.  Avoid buying "ready-to-eat" foods, such as pre-cut fruits and vegetables and pre-made salads.  If possible, shop around to discover where you can find the best prices. Consider other retailers such as dollar stores, larger [employer], local fruit and vegetable stands, and farmers markets.  Do not shop when you are hungry. If you shop while hungry, it may be hard to stick to your  list and budget.  Resist impulse buying. Use your grocery list as your official plan for the week.  Buy a variety of vegetables and fruits by purchasing fresh, frozen, and canned items.  Look at the top and bottom shelves for deals. Foods at eye level (eye level of an adult or child) are usually more expensive.  Be efficient with your time when shopping. The more time you spend at the store, the more money you are likely to spend.  To save money when choosing more expensive foods like meats and dairy: ? Choose cheaper cuts of meat, such as bone-in chicken thighs and drumsticks instead of skinless and boneless chicken. When you are ready to prepare the chicken, you can remove the skin yourself to make  it healthier. ? Choose lean meats like chicken or [place] instead of beef. ? Choose canned seafood, such as tuna, salmon, or sardines. ? Buy eggs as a low-cost source of protein. ? Buy dried beans and peas, such as lentils, split peas, or kidney beans instead of meats. Dried beans and peas are a good alternative source of protein. ? Buy the larger tubs of yogurt instead of individual-sized containers.  Choose water instead of sodas and other sweetened beverages.  Avoid buying chips, cookies, and other "junk food." These items are usually expensive and not healthy.   Cooking  Make extra food and freeze the extras in meal-sized containers or in individual portions for fast meals and snacks.  Pre-cook on days when you have extra time to prepare meals in advance. You can keep these meals in the fridge or freezer and reheat for a quick meal.  When you come home from the grocery store, wash, peel, and cut fruits and vegetables so they are ready to use and eat. This will help reduce food waste. Meal planning  Do not eat out or get fast food. Prepare food at home.  Make a grocery list and make sure to bring it with you to the store. If you have a smart phone, you could use your phone to create your shopping list.  Plan meals and snacks according to a grocery list and budget you create.  Use leftovers in your meal plan for the week.  Look for recipes where you can cook once and make enough food for two meals.  Prepare budget-friendly types of meals like stews, casseroles, and stir-fry dishes.  Try some meatless meals or try "no cook" meals like salads.  Make sure that half your plate is filled with fruits or vegetables. Choose from fresh, frozen, or canned fruits and vegetables. If eating canned, remember to rinse them before eating. This will remove any excess salt added for packaging. Summary  Eating healthy on a budget is possible if you plan your meals according to your budget, purchase  according to your budget and grocery list, and prepare food yourself.  Tips for buying more food on a limited budget include buying generic brands, using coupons only for foods you normally buy, and buying healthy items from the bulk bins when available.  Tips for buying cheaper food to replace expensive food include choosing cheaper, lean cuts of meat, and buying dried beans and peas. This information is not intended to replace advice given to you by your health care provider. Make sure you discuss any questions you have with your health care provider. Document Revised: 07/27/2020 Document Reviewed: 07/27/2020 Elsevier Patient Education  2021 Elsevier Inc.   Bone Health Bones protect organs, store calcium,  anchor muscles, and support the whole body. Keeping your bones strong is important, especially as you get older. You can take actions to help keep your bones strong and healthy. Why is keeping my bones healthy important? Keeping your bones healthy is important because your body constantly replaces bone cells. Cells get old, and new cells take their place. As we age, we lose bone cells because the body may not be able to make enough new cells to replace the old cells. The amount of bone cells and bone tissue you have is referred to as bone mass. The higher your bone mass, the stronger your bones. The aging process leads to an overall loss of bone mass in the body, which can increase the likelihood of:  Joint pain and stiffness.  Broken bones.  A condition in which the bones become weak and brittle (osteoporosis). A large decline in bone mass occurs in older adults. In women, it occurs about the time of menopause.   What actions can I take to keep my bones healthy? Good health habits are important for maintaining healthy bones. This includes eating nutritious foods and exercising regularly. To have healthy bones, you need to get enough of the right minerals and vitamins. Most nutrition  experts recommend getting these nutrients from the foods that you eat. In some cases, taking supplements may also be recommended. Doing certain types of exercise is also important for bone health. What are the nutritional recommendations for healthy bones? Eating a well-balanced diet with plenty of calcium and vitamin D will help to protect your bones. Nutritional recommendations vary from person to person. Ask your health care provider what is healthy for you. Here are some general guidelines. Get enough calcium Calcium is the most important (essential) mineral for bone health. Most people can get enough calcium from their diet, but supplements may be recommended for people who are at risk for osteoporosis. Good sources of calcium include:  Dairy products, such as low-fat or nonfat milk, cheese, and yogurt.  Dark green leafy vegetables, such as bok choy and broccoli.  Calcium-fortified foods, such as orange juice, cereal, bread, soy beverages, and tofu products.  Nuts, such as almonds. Follow these recommended amounts for daily calcium intake:  Children, age 50-3: 700 mg.  Children, age 173-8: 1,000 mg.  Children, age 75-13: 1,300 mg.  Teens, age 54-18: 1,300 mg.  Adults, age 5-50: 1,000 mg.  Adults, age 509-70: ? Men: 1,000 mg. ? Women: 1,200 mg.  Adults, age 500 or older: 1,200 mg.  Pregnant and breastfeeding females: ? Teens: 1,300 mg. ? Adults: 1,000 mg. Get enough vitamin D Vitamin D is the most essential vitamin for bone health. It helps the body absorb calcium. Sunlight stimulates the skin to make vitamin D, so be sure to get enough sunlight. If you live in a cold climate or you do not get outside often, your health care provider may recommend that you take vitamin D supplements. Good sources of vitamin D in your diet include:  Egg yolks.  Saltwater fish.  Milk and cereal fortified with vitamin D. Follow these recommended amounts for daily vitamin D intake:  Children  and teens, age 50-18: 600 international units.  Adults, age 88 or younger: 400-800 international units.  Adults, age 9 or older: 800-1,000 international units. Get other important nutrients Other nutrients that are important for bone health include:  Phosphorus. This mineral is found in meat, poultry, dairy foods, nuts, and legumes. The recommended daily intake for adult men  and adult women is 700 mg.  Magnesium. This mineral is found in seeds, nuts, dark green vegetables, and legumes. The recommended daily intake for adult men is 400-420 mg. For adult women, it is 310-320 mg.  Vitamin K. This vitamin is found in green leafy vegetables. The recommended daily intake is 120 mg for adult men and 90 mg for adult women.   What type of physical activity is best for building and maintaining healthy bones? Weight-bearing and strength-building activities are important for building and maintaining healthy bones. Weight-bearing activities cause muscles and bones to work against gravity. Strength-building activities increase the strength of the muscles that support bones. Weight-bearing and muscle-building activities include:  Walking and hiking.  Jogging and running.  Dancing.  Gym exercises.  Lifting weights.  Tennis and racquetball.  Climbing stairs.  Aerobics. Adults should get at least 30 minutes of moderate physical activity on most days. Children should get at least 60 minutes of moderate physical activity on most days. Ask your health care provider what type of exercise is best for you.   How can I find out if my bone mass is low? Bone mass can be measured with an X-ray test called a bone mineral density (BMD) test. This test is recommended for all women who are age 77 or older. It may also be recommended for:  Men who are age 45 or older.  People who are at risk for osteoporosis because of: ? Having bones that break easily. ? Having a long-term disease that weakens bones, such as  kidney disease or rheumatoid arthritis. ? Having menopause earlier than normal. ? Taking medicine that weakens bones, such as steroids, thyroid hormones, or hormone treatment for breast cancer or prostate cancer. ? Smoking. ? Drinking three or more alcoholic drinks a day. If you find that you have a low bone mass, you may be able to prevent osteoporosis or further bone loss by changing your diet and lifestyle. Where can I find more information? For more information, check out the following websites:  National Osteoporosis Foundation: https://carlson-fletcher.info/  [employer]: www.bones.http://www.myers.net/  International Osteoporosis Foundation: Investment banker, operational.iofbonehealth.org Summary  The aging process leads to an overall loss of bone mass in the body, which can increase the likelihood of broken bones and osteoporosis.  Eating a well-balanced diet with plenty of calcium and vitamin D will help to protect your bones.  Weight-bearing and strength-building activities are also important for building and maintaining strong bones.  Bone mass can be measured with an X-ray test called a bone mineral density (BMD) test. This information is not intended to replace advice given to you by your health care provider. Make sure you discuss any questions you have with your health care provider. Document Revised: 11/10/2017 Document Reviewed: 11/10/2017 Elsevier Patient Education  2021 Elsevier Inc.   http://APA.org/depression-guideline"> https://clinicalkey.com"> http://point-of-care.elsevierperformancemanager.com/skills/"> http://point-of-care.elsevierperformancemanager.com">  Managing Depression, Adult Depression is a mental health condition that affects your thoughts, feelings, and actions. Being diagnosed with depression can bring you relief if you did not know why you have felt or behaved a certain way. It could also leave you feeling overwhelmed with uncertainty about your future. Preparing yourself to  manage your symptoms can help you feel more positive about your future. How to manage lifestyle changes Managing stress Stress is your body's reaction to life changes and events, both good and bad. Stress can add to your feelings of depression. Learning to manage your stress can help lessen your feelings of depression. Try some of the  following approaches to reducing your stress (stress reduction techniques):  Listen to music that you enjoy and that inspires you.  Try using a meditation app or take a meditation class.  Develop a practice that helps you connect with your spiritual self. Walk in nature, pray, or go to a place of worship.  Do some deep breathing. To do this, inhale slowly through your nose. Pause at the top of your inhale for a few seconds and then exhale slowly, letting your muscles relax.  Practice yoga to help relax and work your muscles. Choose a stress reduction technique that suits your lifestyle and personality. These techniques take time and practice to develop. Set aside 5-15 minutes a day to do them. Therapists can offer training in these techniques. Other things you can do to manage stress include:  Keeping a stress diary.  Knowing your limits and saying no when you think something is too much.  Paying attention to how you react to certain situations. You may not be able to control everything, but you can change your reaction.  Adding humor to your life by watching funny films or TV shows.  Making time for activities that you enjoy and that relax you.   Medicines Medicines, such as antidepressants, are often a part of treatment for depression.  Talk with your pharmacist or health care provider about all the medicines, supplements, and herbal products that you take, their possible side effects, and what medicines and other products are safe to take together.  Make sure to report any side effects you may have to your health care provider. Relationships Your  health care provider may suggest family therapy, couples therapy, or individual therapy as part of your treatment. How to recognize changes Everyone responds differently to treatment for depression. As you recover from depression, you may start to:  Have more interest in doing activities.  Feel less hopeless.  Have more energy.  Overeat less often, or have a better appetite.  Have better mental focus. It is important to recognize if your depression is not getting better or is getting worse. The symptoms you had in the beginning may return, such as:  Tiredness (fatigue) or low energy.  Eating too much or too little.  Sleeping too much or too little.  Feeling restless, agitated, or hopeless.  Trouble focusing or making decisions.  Unexplained physical complaints.  Feeling irritable, angry, or aggressive. If you or your family members notice these symptoms coming back, let your health care provider know right away. Follow these instructions at home: Activity  Try to get some form of exercise each day, such as walking, biking, swimming, or lifting weights.  Practice stress reduction techniques.  Engage your mind by taking a class or doing some volunteer work.   Lifestyle  Get the right amount and quality of sleep.  Cut down on using caffeine, tobacco, alcohol, and other potentially harmful substances.  Eat a healthy diet that includes plenty of vegetables, fruits, whole grains, low-fat dairy products, and lean protein. Do not eat a lot of foods that are high in solid fats, added sugars, or salt (sodium). General instructions  Take over-the-counter and prescription medicines only as told by your health care provider.  Keep all follow-up visits as told by your health care provider. This is important. Where to find support Talking to others Friends and family members can be sources of support and guidance. Talk to trusted friends or family members about your condition.  Explain your symptoms to them,  and let them know that you are working with a health care provider to treat your depression. Tell friends and family members how they also can be helpful.   Finances  Find appropriate mental health providers that fit with your financial situation.  Talk with your health care provider about options to get reduced prices on your medicines. Where to find more information You can find support in your area from:  Anxiety and Depression Association of America (ADAA): www.adaa.org  Mental Health America: www.mentalhealthamerica.net  [employer] on Mental Illness: www.nami.org Contact a health care provider if:  You stop taking your antidepressant medicines, and you have any of these symptoms: ? Nausea. ? Headache. ? Light-headedness. ? Chills and body aches. ? Not being able to sleep (insomnia).  You or your friends and family think your depression is getting worse. Get help right away if:  You have thoughts of hurting yourself or others. If you ever feel like you may hurt yourself or others, or have thoughts about taking your own life, get help right away. Go to your nearest emergency department or:  Call your local emergency services (911 in the [place]  Call a suicide crisis helpline, such as the National Suicide Prevention Lifeline at 317 339 12401-609-215-0383. This is open 24 hours a day in the [place]  Text the Crisis Text Line at 737-608-3132741741 (in the [place] Summary  If you are diagnosed with depression, preparing yourself to manage your symptoms is a good way to feel positive about your future.  Work with your health care provider on a management plan that includes stress reduction techniques, medicines (if applicable), therapy, and healthy lifestyle habits.  Keep talking with your health care provider about how your treatment is working.  If you have thoughts about taking your own life, call a suicide crisis helpline or text a crisis text line. This  information is not intended to replace advice given to you by your health care provider. Make sure you discuss any questions you have with your health care provider. Document Revised: 08/25/2019 Document Reviewed: 08/25/2019 Elsevier Patient Education  2021 Elsevier Inc.   Managing Anxiety, Adult After being diagnosed with an anxiety disorder, you may be relieved to know why you have felt or behaved a certain way. You may also feel overwhelmed about the treatment ahead and what it will mean for your life. With care and support, you can manage this condition and recover from it. How to manage lifestyle changes Managing stress and anxiety Stress is your body's reaction to life changes and events, both good and bad. Most stress will last just a few hours, but stress can be ongoing and can lead to more than just stress. Although stress can play a major role in anxiety, it is not the same as anxiety. Stress is usually caused by something external, such as a deadline, test, or competition. Stress normally passes after the triggering event has ended.  Anxiety is caused by something internal, such as imagining a terrible outcome or worrying that something will go wrong that will devastate you. Anxiety often does not go away even after the triggering event is over, and it can become long-term (chronic) worry. It is important to understand the differences between stress and anxiety and to manage your stress effectively so that it does not lead to an anxious response. Talk with your health care provider or a counselor to learn more about reducing anxiety and stress. He or she may suggest tension reduction techniques, such as:  Music  therapy. This can include creating or listening to music that you enjoy and that inspires you.  Mindfulness-based meditation. This involves being aware of your normal breaths while not trying to control your breathing. It can be done while sitting or walking.  Centering prayer.  This involves focusing on a word, phrase, or sacred image that means something to you and brings you peace.  Deep breathing. To do this, expand your stomach and inhale slowly through your nose. Hold your breath for 3-5 seconds. Then exhale slowly, letting your stomach muscles relax.  Self-talk. This involves identifying thought patterns that lead to anxiety reactions and changing those patterns.  Muscle relaxation. This involves tensing muscles and then relaxing them. Choose a tension reduction technique that suits your lifestyle and personality. These techniques take time and practice. Set aside 5-15 minutes a day to do them. Therapists can offer counseling and training in these techniques. The training to help with anxiety may be covered by some insurance plans. Other things you can do to manage stress and anxiety include:  Keeping a stress/anxiety diary. This can help you learn what triggers your reaction and then learn ways to manage your response.  Thinking about how you react to certain situations. You may not be able to control everything, but you can control your response.  Making time for activities that help you relax and not feeling guilty about spending your time in this way.  Visual imagery and yoga can help you stay calm and relax.   Medicines Medicines can help ease symptoms. Medicines for anxiety include:  Anti-anxiety drugs.  Antidepressants. Medicines are often used as a primary treatment for anxiety disorder. Medicines will be prescribed by a health care provider. When used together, medicines, psychotherapy, and tension reduction techniques may be the most effective treatment. Relationships Relationships can play a big part in helping you recover. Try to spend more time connecting with trusted friends and family members. Consider going to couples counseling, taking family education classes, or going to family therapy. Therapy can help you and others better understand your  condition. How to recognize changes in your anxiety Everyone responds differently to treatment for anxiety. Recovery from anxiety happens when symptoms decrease and stop interfering with your daily activities at home or work. This may mean that you will start to:  Have better concentration and focus. Worry will interfere less in your daily thinking.  Sleep better.  Be less irritable.  Have more energy.  Have improved memory. It is important to recognize when your condition is getting worse. Contact your health care provider if your symptoms interfere with home or work and you feel like your condition is not improving. Follow these instructions at home: Activity  Exercise. Most adults should do the following: ? Exercise for at least 150 minutes each week. The exercise should increase your heart rate and make you sweat (moderate-intensity exercise). ? Strengthening exercises at least twice a week.  Get the right amount and quality of sleep. Most adults need 7-9 hours of sleep each night. Lifestyle  Eat a healthy diet that includes plenty of vegetables, fruits, whole grains, low-fat dairy products, and lean protein. Do not eat a lot of foods that are high in solid fats, added sugars, or salt.  Make choices that simplify your life.  Do not use any products that contain nicotine or tobacco, such as cigarettes, e-cigarettes, and chewing tobacco. If you need help quitting, ask your health care provider.  Avoid caffeine, alcohol, and certain over-the-counter  cold medicines. These may make you feel worse. Ask your pharmacist which medicines to avoid.   General instructions  Take over-the-counter and prescription medicines only as told by your health care provider.  Keep all follow-up visits as told by your health care provider. This is important. Where to find support You can get help and support from these sources:  Self-help groups.  Online and [employer].  A trusted  spiritual leader.  Couples counseling.  Family education classes.  Family therapy. Where to find more information You may find that joining a support group helps you deal with your anxiety. The following sources can help you locate counselors or support groups near you:  Mental Health America: www.mentalhealthamerica.net  Anxiety and Depression Association of [place] (ADAA): ProgramCam.de  The [employer] on Mental Illness (NAMI): www.nami.org Contact a health care provider if you:  Have a hard time staying focused or finishing daily tasks.  Spend many hours a day feeling worried about everyday life.  Become exhausted by worry.  Start to have headaches, feel tense, or have nausea.  Urinate more than normal.  Have diarrhea. Get help right away if you have:  A racing heart and shortness of breath.  Thoughts of hurting yourself or others. If you ever feel like you may hurt yourself or others, or have thoughts about taking your own life, get help right away. You can go to your nearest emergency department or call:  Your local emergency services (911 in the [place]  A suicide crisis helpline, such as the National Suicide Prevention Lifeline at (519)167-3536. This is open 24 hours a day. Summary  Taking steps to learn and use tension reduction techniques can help calm you and help prevent triggering an anxiety reaction.  When used together, medicines, psychotherapy, and tension reduction techniques may be the most effective treatment.  Family, friends, and partners can play a big part in helping you recover from an anxiety disorder. This information is not intended to replace advice given to you by your health care provider. Make sure you discuss any questions you have with your health care provider. Document Revised: 03/16/2019 Document Reviewed: 03/16/2019 Elsevier Patient Education  2021 [employer].

## 2021-01-02 LAB — CYTOLOGY - PAP
Comment: NEGATIVE
Diagnosis: NEGATIVE
High risk HPV: NEGATIVE

## 2021-12-12 ENCOUNTER — Encounter: Payer: Self-pay | Admitting: Obstetrics and Gynecology

## 2021-12-12 ENCOUNTER — Other Ambulatory Visit (HOSPITAL_COMMUNITY)
Admission: RE | Admit: 2021-12-12 | Discharge: 2021-12-12 | Disposition: A | Discharge status: Home / Self Care | Payer: 59 | Source: Ambulatory Visit | Attending: Obstetrics and Gynecology | Admitting: Obstetrics and Gynecology

## 2021-12-12 ENCOUNTER — Ambulatory Visit (INDEPENDENT_AMBULATORY_CARE_PROVIDER_SITE_OTHER): Payer: 59 | Admitting: Obstetrics and Gynecology

## 2021-12-12 ENCOUNTER — Other Ambulatory Visit: Payer: Self-pay

## 2021-12-12 VITALS — BP 118/74 | Wt 240.8 lb

## 2021-12-12 DIAGNOSIS — Z3400 Encounter for supervision of normal first pregnancy, unspecified trimester: Secondary | ICD-10-CM | POA: Insufficient documentation

## 2021-12-12 DIAGNOSIS — N926 Irregular menstruation, unspecified: Secondary | ICD-10-CM | POA: Diagnosis not present

## 2021-12-12 DIAGNOSIS — Z1371 Encounter for nonprocreative screening for genetic disease carrier status: Secondary | ICD-10-CM

## 2021-12-12 LAB — POCT URINALYSIS DIPSTICK OB
Glucose, UA: NEGATIVE
POC,PROTEIN,UA: NEGATIVE

## 2021-12-12 LAB — OB RESULTS CONSOLE VARICELLA ZOSTER ANTIBODY, IGG: Varicella: IMMUNE

## 2021-12-12 LAB — POCT URINE PREGNANCY: Preg Test, Ur: POSITIVE — AB

## 2021-12-12 NOTE — Patient Instructions (Signed)
First Trimester of Pregnancy °The first trimester of pregnancy starts on the first day of your last menstrual period until the end of week 12. This is months 1 through 3 of pregnancy. A week after a sperm fertilizes an egg, the egg will implant into the wall of the uterus and begin to develop into a baby. By the end of 12 weeks, all the baby's organs will be formed and the baby will be 2-3 inches in size. °Body changes during your first trimester °Your body goes through many changes during pregnancy. The changes vary and generally return to normal after your baby is born. °Physical changes °You may gain or lose weight. °Your breasts may begin to grow larger and become tender. The tissue that surrounds your nipples (areola) may become darker. °Dark spots or blotches (chloasma or mask of pregnancy) may develop on your face. °You may have changes in your hair. These can include thickening or thinning of your hair or changes in texture. °Health changes °You may feel nauseous, and you may vomit. °You may have heartburn. °You may develop headaches. °You may develop constipation. °Your gums may bleed and may be sensitive to brushing and flossing. °Other changes °You may tire easily. °You may urinate more often. °Your menstrual periods will stop. °You may have a loss of appetite. °You may develop cravings for certain kinds of food. °You may have changes in your emotions from day to day. °You may have more vivid and strange dreams. °Follow these instructions at home: °Medicines °Follow your health care provider's instructions regarding medicine use. Specific medicines may be either safe or unsafe to take during pregnancy. Do not take any medicines unless told to by your health care provider. °Take a prenatal vitamin that contains at least 600 micrograms (mcg) of folic acid. °Eating and drinking °Eat a healthy diet that includes fresh fruits and vegetables, whole grains, good sources of protein such as meat, eggs, or tofu,  and low-fat dairy products. °Avoid raw meat and unpasteurized juice, milk, and cheese. These carry germs that can harm you and your baby. °If you feel nauseous or you vomit: °Eat 4 or 5 small meals a day instead of 3 large meals. °Try eating a few soda crackers. °Drink liquids between meals instead of during meals. °You may need to take these actions to prevent or treat constipation: °Drink enough fluid to keep your urine pale yellow. °Eat foods that are high in fiber, such as beans, whole grains, and fresh fruits and vegetables. °Limit foods that are high in fat and processed sugars, such as fried or sweet foods. °Activity °Exercise only as directed by your health care provider. Most people can continue their usual exercise routine during pregnancy. Try to exercise for 30 minutes at least 5 days a week. °Stop exercising if you develop pain or cramping in the lower abdomen or lower back. °Avoid exercising if it is very hot or humid or if you are at high altitude. °Avoid heavy lifting. °If you choose to, you may have sex unless your health care provider tells you not to. °Relieving pain and discomfort °Wear a good support bra to relieve breast tenderness. °Rest with your legs elevated if you have leg cramps or low back pain. °If you develop bulging veins (varicose veins) in your legs: °Wear support hose as told by your health care provider. °Elevate your feet for 15 minutes, 3-4 times a day. °Limit salt in your diet. °Safety °Wear your seat belt at all times when driving   or riding in a car. °Talk with your health care provider if someone is verbally or physically abusive to you. °Talk with your health care provider if you are feeling sad or have thoughts of hurting yourself. °Lifestyle °Do not use hot tubs, steam rooms, or saunas. °Do not douche. Do not use tampons or scented sanitary pads. °Do not use herbal remedies, alcohol, illegal drugs, or medicines that are not approved by your health care provider. Chemicals  in these products can harm your baby. °Do not use any products that contain nicotine or tobacco, such as cigarettes, e-cigarettes, and chewing tobacco. If you need help quitting, ask your health care provider. °Avoid cat litter boxes and soil used by cats. These carry germs that can cause birth defects in the baby and possibly loss of the unborn baby (fetus) by miscarriage or stillbirth. °General instructions °During routine prenatal visits in the first trimester, your health care provider will do a physical exam, perform necessary tests, and ask you how things are going. Keep all follow-up visits. This is important. °Ask for help if you have counseling or nutritional needs during pregnancy. Your health care provider can offer advice or refer you to specialists for help with various needs. °Schedule a dentist appointment. At home, brush your teeth with a soft toothbrush. Floss gently. °Write down your questions. Take them to your prenatal visits. °Where to find more information °American Pregnancy Association: americanpregnancy.org °American College of Obstetricians and Gynecologists: acog.org/en/Womens%20Health/Pregnancy °Office on Women's Health: womenshealth.gov/pregnancy °Contact a health care provider if you have: °Dizziness. °A fever. °Mild pelvic cramps, pelvic pressure, or nagging pain in the abdominal area. °Nausea, vomiting, or diarrhea that lasts for 24 hours or longer. °A bad-smelling vaginal discharge. °Pain when you urinate. °Known exposure to a contagious illness, such as chickenpox, measles, Zika virus, HIV, or hepatitis. °Get help right away if you have: °Spotting or bleeding from your vagina. °Severe abdominal cramping or pain. °Shortness of breath or chest pain. °Any kind of trauma, such as from a fall or a car crash. °New or increased pain, swelling, or redness in an arm or leg. °Summary °The first trimester of pregnancy starts on the first day of your last menstrual period until the end of week  12 (months 1 through 3). °Eating 4 or 5 small meals a day rather than 3 large meals may help to relieve nausea and vomiting. °Do not use any products that contain nicotine or tobacco, such as cigarettes, e-cigarettes, and chewing tobacco. If you need help quitting, ask your health care provider. °Keep all follow-up visits. This is important. °This information is not intended to replace advice given to you by your health care provider. Make sure you discuss any questions you have with your health care provider. °Document Revised: 03/22/2020 Document Reviewed: 01/27/2020 °Elsevier Patient Education © 2022 Elsevier Inc. ° °

## 2021-12-12 NOTE — Progress Notes (Signed)
12/12/2021   Chief Complaint: Missed period  Transfer of Care Patient: no  History of Present Illness: Audrey Clarke is a 31 y.o. G1P0000 Unknown based on Patient's last menstrual period was 10/14/2021 (exact date). with an Estimated Date of Delivery: None noted., with the above CC.   Her periods were: regular monthly periods She was using no method when she conceived.  She has Negative signs or symptoms of nausea/vomiting of pregnancy. She has Negative signs or symptoms of miscarriage or preterm labor She was taking different medications around the time she conceived/early pregnancy. Since her LMP, she has not used alcohol Since her LMP, she has not used tobacco products Since her LMP, she has not used illegal drugs.    Current or past history of domestic violence. no  Infection History:  1. Since her LMP, she has not had a viral illness.  2. She denies close contact with children on a regular basis     3. She has a history of chicken pox. She denies vaccination for chicken pox in the past. 4. Patient or partner has history of genital herpes  no 5. History of STI (GC, CT, HPV, syphilis, HIV)  no   6.  She does not live with someone with TB or TB exposed. 7. History of recent travel :  no 8. She identifies Negative Zika risk factors for her and her partner 35. There are cats in the home in the home.  She understands that while pregnant she should not change cat litter.   Genetic Screening Questions: (Includes patient, baby's father, or anyone in either family)   1. Patient's age >/= 8 at [facility]  no 2. Thalassemia ([place], [place], [place], or Asian background): MCV<80  no 3. Neural tube defect (meningomyelocele, spina bifida, anencephaly)  no 4. Congenital heart defect  no  5. Down syndrome  no 6. Tay-Sachs (Jewish, [place])  no 7. Canavan's Disease  no 8. Sickle cell disease or trait (African)  no  9. Hemophilia or other blood disorders  no  10. Muscular dystrophy   no  11. Cystic fibrosis  no  12. Huntington's Chorea  no  13. Mental retardation/autism  no 14. Other inherited genetic or chromosomal disorder  no 15. Maternal metabolic disorder (DM, PKU, etc)  no 16. Patient or FOB with a child with a birth defect not listed above no  16a. Patient or FOB with a birth defect themselves no 17. Recurrent pregnancy loss, or stillbirth  no  18. Any medications since LMP other than prenatal vitamins (include vitamins, supplements, OTC meds, drugs, alcohol)  no 19. Any other genetic/environmental exposure to discuss  no  ROS:  ROS  OBGYN History: As per HPI. OB History  Gravida Para Term Preterm AB Living  1 0 0 0 0 0  SAB IAB Ectopic Multiple Live Births  0 0 0 0 0    # Outcome Date GA Lbr Len/2nd Weight Sex Delivery Anes PTL Lv  1 Current             Any issues with any prior pregnancies: no Any prior children are healthy, doing well, without any problems or issues: not applicable Last pap smear 2022 NIL History of STIs: No   Past Medical History: Past Medical History:  Diagnosis Date   BRCA negative 07/2019   MyRisk neg   Family history of breast cancer    Increased risk of breast cancer 07/2019   IBIS=27.7%/riskscore=22.9%   PCOS (polycystic ovarian syndrome)  Vitamin B12 deficiency    Vitamin D deficiency     Past Surgical History: No past surgical history on file.  Family History:  Family History  Problem Relation Age of Onset   Breast cancer Maternal Aunt 45   Breast cancer Maternal Grandmother 34   She denies any female cancers, bleeding or blood clotting disorders.   Social History:  Social History   Socioeconomic History   Marital status: Married    Spouse name: Not on file   Number of children: Not on file   Years of education: Not on file   Highest education level: Not on file  Occupational History   Not on file  Tobacco Use   Smoking status: Never   Smokeless tobacco: Never  Vaping Use   Vaping Use:  Never used  Substance and Sexual Activity   Alcohol use: Yes   Drug use: Never   Sexual activity: Yes  Other Topics Concern   Not on file  Social History Narrative   Not on file   Social Determinants of Health   Financial Resource Strain: Not on file  Food Insecurity: Not on file  Transportation Needs: Not on file  Physical Activity: Not on file  Stress: Not on file  Social Connections: Not on file  Intimate Partner Violence: Not on file    Allergy: Allergies  Allergen Reactions   Augmentin [Amoxicillin-Pot Clavulanate] Diarrhea and Nausea And Vomiting    Current Outpatient Medications:  Current Outpatient Medications:    Prenatal Vit-Fe Fumarate-FA (MULTIVITAMIN-PRENATAL) 27-0.8 MG TABS tablet, Take 1 tablet by mouth daily at 12 noon., Disp: , Rfl:    ALPRAZolam (XANAX) 0.5 MG tablet, Take 1 tablet (0.5 mg total) by mouth at bedtime as needed for anxiety. (Patient not taking: Reported on 12/28/2020), Disp: 30 tablet, Rfl: 5   levonorgestrel (MIRENA) 20 MCG/24HR IUD, 1 each by Intrauterine route once. (Patient not taking: Reported on 12/28/2020), Disp: , Rfl:    norethindrone-ethinyl estradiol (JUNEL FE 1/20) 1-20 MG-MCG tablet, Take 1 tablet by mouth daily. (Patient not taking: Reported on 12/28/2020), Disp: 1 Package, Rfl: 11   sertraline (ZOLOFT) 50 MG tablet, Take 1 tablet (50 mg total) by mouth daily., Disp: 30 tablet, Rfl: 2   Physical Exam: Physical Exam   Assessment: Audrey Clarke is a 31 y.o. G1P0000 Unknown based on Patient's last menstrual period was 10/14/2021 (exact date). with an Estimated Date of Delivery: None noted.,  for prenatal care.  Plan:  1) Avoid alcoholic beverages. 2) Patient encouraged not to smoke.  3) Discontinue the use of all non-medicinal drugs and chemicals.  4) Take prenatal vitamins daily.  5) Seatbelt use advised 6) Nutrition, food safety (fish, cheese advisories, and high nitrite foods) and exercise discussed. 7) Hospital and practice  style delivering at [facility] discussed  8) Patient is asked about travel to areas at risk for the [place] virus, and counseled to avoid travel and exposure to mosquitoes or sexual partners who may have themselves been exposed to the virus. Testing is discussed, and will be ordered as appropriate.  9) [facility]birth classes at [facility] advised 10) Genetic Screening, such as with 1st Trimester Screening, cell free fetal DNA, AFP testing, and Ultrasound, as well as with amniocentesis and CVS as appropriate, is discussed with patient. She plans to have genetic testing this pregnancy.  TVUS today showed IUP with + FHR 179 bpm. CRL 12 weeks 1 day, given period discordance will order dating [place].   NOB labs today.   Recommended  81 mg ASA at 12 weeks.  Problem list reviewed and updated.  I discussed the assessment and treatment plan with the patient. The patient was provided an opportunity to ask questions and all were answered. The patient agreed with the plan and demonstrated an understanding of the instructions.  Adrian Prows MD [facility], [facility] 12/12/2021 3:01 PM

## 2021-12-13 LAB — MONITOR DRUG PROFILE 10(MW)
Amphetamine Scrn, Ur: NEGATIVE ng/mL
BARBITURATE SCREEN URINE: NEGATIVE ng/mL
BENZODIAZEPINE SCREEN, URINE: NEGATIVE ng/mL
CANNABINOIDS UR QL SCN: NEGATIVE ng/mL
Cocaine (Metab) Scrn, Ur: NEGATIVE ng/mL
Creatinine(Crt), U: 82.1 mg/dL (ref 20.0–300.0)
Methadone Screen, Urine: NEGATIVE ng/mL
OXYCODONE+OXYMORPHONE UR QL SCN: NEGATIVE ng/mL
Opiate Scrn, Ur: NEGATIVE ng/mL
Ph of Urine: 6.5 (ref 4.5–8.9)
Phencyclidine Qn, Ur: NEGATIVE ng/mL
Propoxyphene Scrn, Ur: NEGATIVE ng/mL

## 2021-12-13 LAB — RPR+RH+ABO+RUB AB+AB SCR+CB...
Antibody Screen: NEGATIVE
HIV Screen 4th Generation wRfx: NONREACTIVE
Hematocrit: 40.5 % (ref 34.0–46.6)
Hemoglobin: 13.4 g/dL (ref 11.1–15.9)
Hepatitis B Surface Ag: NEGATIVE
MCH: 30.8 pg (ref 26.6–33.0)
MCHC: 33.1 g/dL (ref 31.5–35.7)
MCV: 93 fL (ref 79–97)
Platelets: 229 10*3/uL (ref 150–450)
RBC: 4.35 x10E6/uL (ref 3.77–5.28)
RDW: 13.1 % (ref 11.7–15.4)
RPR Ser Ql: NONREACTIVE
Rh Factor: NEGATIVE
Rubella Antibodies, IGG: 1.59 index (ref >0.99)
Varicella zoster IgG: 2239 index (ref >165)
WBC: 7.9 10*3/uL (ref 3.4–10.8)

## 2021-12-13 LAB — HEPATITIS C ANTIBODY: Hep C Virus Ab: NONREACTIVE

## 2021-12-14 LAB — CERVICOVAGINAL ANCILLARY ONLY
Chlamydia: NEGATIVE
Comment: NEGATIVE
Comment: NEGATIVE
Comment: NORMAL
Neisseria Gonorrhea: NEGATIVE
Trichomonas: NEGATIVE

## 2021-12-14 LAB — URINE CULTURE

## 2021-12-24 ENCOUNTER — Ambulatory Visit
Admission: RE | Admit: 2021-12-24 | Discharge: 2021-12-24 | Disposition: A | Discharge status: Home / Self Care | Payer: 59 | Source: Ambulatory Visit | Attending: Obstetrics and Gynecology | Admitting: Obstetrics and Gynecology

## 2021-12-24 DIAGNOSIS — Z3400 Encounter for supervision of normal first pregnancy, unspecified trimester: Secondary | ICD-10-CM

## 2021-12-24 DIAGNOSIS — O209 Hemorrhage in early pregnancy, unspecified: Secondary | ICD-10-CM | POA: Insufficient documentation

## 2021-12-24 DIAGNOSIS — Z3687 Encounter for antenatal screening for uncertain dates: Secondary | ICD-10-CM | POA: Diagnosis present

## 2021-12-24 DIAGNOSIS — Z3A10 10 weeks gestation of pregnancy: Secondary | ICD-10-CM | POA: Insufficient documentation

## 2021-12-27 ENCOUNTER — Encounter: Payer: Self-pay | Admitting: Advanced Practice Midwife

## 2021-12-27 ENCOUNTER — Ambulatory Visit (INDEPENDENT_AMBULATORY_CARE_PROVIDER_SITE_OTHER): Payer: 59 | Admitting: Advanced Practice Midwife

## 2021-12-27 ENCOUNTER — Other Ambulatory Visit: Payer: Self-pay

## 2021-12-27 VITALS — BP 102/70 | Wt 239.0 lb

## 2021-12-27 DIAGNOSIS — Z131 Encounter for screening for diabetes mellitus: Secondary | ICD-10-CM

## 2021-12-27 DIAGNOSIS — Z3400 Encounter for supervision of normal first pregnancy, unspecified trimester: Secondary | ICD-10-CM

## 2021-12-27 DIAGNOSIS — O99210 Obesity complicating pregnancy, unspecified trimester: Secondary | ICD-10-CM

## 2021-12-27 DIAGNOSIS — Z3A10 10 weeks gestation of pregnancy: Secondary | ICD-10-CM

## 2021-12-27 DIAGNOSIS — Z369 Encounter for antenatal screening, unspecified: Secondary | ICD-10-CM

## 2021-12-27 DIAGNOSIS — Z1379 Encounter for other screening for genetic and chromosomal anomalies: Secondary | ICD-10-CM

## 2021-12-27 HISTORY — DX: Obesity complicating pregnancy, unspecified trimester: O99.210

## 2021-12-27 LAB — POCT URINALYSIS DIPSTICK OB
Glucose, UA: NEGATIVE
POC,PROTEIN,UA: NEGATIVE

## 2021-12-27 NOTE — Progress Notes (Signed)
Routine Prenatal Care Visit ? ?Subjective  ?Audrey Clarke is a 31 y.o. G1P0000 at [redacted]w[redacted]d being seen today for ongoing prenatal care.  She is currently monitored for the following issues for this low-risk pregnancy and has Supervision of normal first pregnancy, antepartum; Anxiety with depression; Chronic migraine without aura without status migrainosus, not intractable; and Obesity affecting pregnancy on their problem list.  ?----------------------------------------------------------------------------------- ?Patient reports no complaints.  Her questions regarding Rh negative/need for rhogam are answered. She desires genetic screening today. ? . Vag. Bleeding: None.   . Leaking Fluid denies.  ?----------------------------------------------------------------------------------- ?The following portions of the patient's history were reviewed and updated as appropriate: allergies, current medications, past family history, past medical history, past social history, past surgical history and problem list. Problem list updated. ? ?Objective  ?Blood pressure 102/70, weight 239 lb (108.4 kg), last menstrual period 10/14/2021. ?Pregravid weight 239 lb (108.4 kg) Total Weight Gain 0 lb (0 kg) ?Urinalysis: Urine Protein    Urine Glucose   ? ?Fetal Status:          ? ?General:  Alert, oriented and cooperative. Patient is in no acute distress.  ?Skin: Skin is warm and dry. No rash noted.   ?Cardiovascular: Normal heart rate noted  ?Respiratory: Normal respiratory effort, no problems with respiration noted  ?Abdomen: Soft, gravid, appropriate for gestational age. Pain/Pressure: Absent     ?Pelvic:  Cervical exam deferred        ?Extremities: Normal range of motion.     ?Mental Status: Normal mood and affect. Normal behavior. Normal judgment and thought content.  ? ?Assessment  ? ?31 y.o. G1P0000 at [redacted]w[redacted]d by  07/21/2022, by Last Menstrual Period presenting for routine prenatal visit ? ?Plan  ? ?pregnancy 1 Problems (from 12/12/21 to  present)   ? Problem Noted Resolved  ? Obesity affecting pregnancy 12/27/2021 by Tresea Mall, CNM No  ? Supervision of normal first pregnancy, antepartum 12/12/2021 by Natale Milch, MD No  ? Overview Signed 12/12/2021  3:37 PM by Natale Milch, MD  ?   ?Nursing Staff Provider  ?Office Location  [facility] Dating  EDD by LMP c/w 10w  ?Language  English Anatomy US    ?Flu Vaccine   Genetic Screen  NIPS:   ?TDaP vaccine    Hgb A1C or  ?GTT Early : ?Third trimester :   ?Covid vaccinated   LAB RESULTS   ?Rhogam   Blood Type     ?Feeding Plan  Antibody    ?Contraception  Rubella    ?Circumcision  RPR     ?Pediatrician   HBsAg     ?Support Person  HIV    ?Prenatal Classes  Varicella   ?  GBS  (For PCN allergy, check sensitivities)   ?BTL Consent     ?VBAC Consent  Pap  2022 NIL  ?  Hgb Electro    ?  CF   ?   SMA   ?     ? ? ?  ?  ?  ?MaterniT 21 and Hgb A1C today  ? ?Preterm labor symptoms and general obstetric precautions including but not limited to vaginal bleeding, contractions, leaking of fluid and fetal movement were reviewed in detail with the patient. ?Please refer to After Visit Summary for other counseling recommendations.  ? ?Return in about 4 weeks (around 01/24/2022) for rob. ? ?Tresea Mall, CNM ?12/27/2021 4:21 PM   ? ?

## 2021-12-28 ENCOUNTER — Other Ambulatory Visit: Payer: Self-pay | Admitting: Obstetrics and Gynecology

## 2021-12-28 DIAGNOSIS — Z148 Genetic carrier of other disease: Secondary | ICD-10-CM

## 2021-12-28 LAB — HGB A1C W/O EAG: Hgb A1c MFr Bld: 5.3 % (ref 4.8–5.6)

## 2021-12-28 LAB — INHERITEST CORE(CF97,SMA,FRAX)

## 2021-12-28 NOTE — Progress Notes (Signed)
Referral to MFM genetics placed for + SMA carrier ?

## 2022-01-01 ENCOUNTER — Telehealth: Payer: Self-pay | Admitting: Advanced Practice Midwife

## 2022-01-01 NOTE — Telephone Encounter (Signed)
LMVM to notify patient appointments available Thursday at 9 or 10 am. We will schedule her for 9 am. Requested a return call to confirm or notify of need to r/s. ?

## 2022-01-01 NOTE — Telephone Encounter (Signed)
Pt call and said she was returning your call. Pt said that she knew you said call and make appt but she didn't know what kind of appt to schedule. And said that you can call her back and if you don't get her you can leave a detail message on her voicemail. ?

## 2022-01-02 NOTE — Telephone Encounter (Signed)
Patient transferred from front desk. Confirmed Thursday 01/03/22 9am apt. Inquired if husband needed to attend. Advised per [facility] Katrina Stack, that would be ideal - bc she will recommend testing for him. ?

## 2022-01-03 ENCOUNTER — Ambulatory Visit: Payer: 59 | Attending: Maternal & Fetal Medicine

## 2022-01-03 ENCOUNTER — Other Ambulatory Visit: Payer: Self-pay

## 2022-01-03 VITALS — BP 130/91 | HR 86 | Temp 98.0°F | Ht 66.0 in | Wt 235.0 lb

## 2022-01-03 DIAGNOSIS — Z148 Genetic carrier of other disease: Secondary | ICD-10-CM

## 2022-01-03 DIAGNOSIS — Z3400 Encounter for supervision of normal first pregnancy, unspecified trimester: Secondary | ICD-10-CM

## 2022-01-03 DIAGNOSIS — O99211 Obesity complicating pregnancy, first trimester: Secondary | ICD-10-CM

## 2022-01-03 LAB — MATERNIT21 PLUS CORE+SCA
Fetal Fraction: 3
Monosomy X (Turner Syndrome): NOT DETECTED
Result (T21): NEGATIVE
Trisomy 13 (Patau syndrome): NEGATIVE
Trisomy 18 (Edwards syndrome): NEGATIVE
Trisomy 21 (Down syndrome): NEGATIVE
XXX (Triple X Syndrome): NOT DETECTED
XXY (Klinefelter Syndrome): NOT DETECTED
XYY (Jacobs Syndrome): NOT DETECTED

## 2022-01-03 NOTE — Progress Notes (Signed)
Referring provider: Irena Cords, Dr. Gilman Schmidt ?Length of consultation:  45 minutes ? ? 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 SMA is characterized by progressive muscle weakness and atrophy due to degeneration and loss of anterior horn cells (lower motor neurons) in the spinal cord and brain stem. We discussed the different types of SMA (0, I, II, and III), including differences in severity and age of onset.  ?  ?Pathogenic variants in the SMN1 gene cause SMA. The number of copies of another related gene called SMN2 modifies the severity of the condition and helps determine which form of SMA an affected individual will develop. The SMN1 and SMN2 genes both provide instructions for the survival motor neuron (SMN) protein. This protein has important functions in the maintenance of motor neurons in the skeletal muscles that allow the body to move. Typically, most functional SMN protein is produced from the SMN1 gene, with a small amount produced from the SMN2 gene. Most individuals with SMA are missing a piece of the SMN1 gene which impairs SMN protein production. A shortage of SMN protein causes progressive motor neuron death, leading to the signs and symptoms of SMA. However, the SMN protein produced by the SMN2 gene can help make up for the protein deficiency caused by pathogenic variants in SMN1. Having multiple copies of the SMN2 gene is usually associated with less severe features and later onset of symptoms. ?  ?We reviewed that SMA is inherited in an autosomal recessive pattern. This means that both parents must be carriers for the condition to be at risk of having an affected child. Based on the carrier frequency for SMA in the African American population, Ms. Antonini's partner Alroy Dust  currently has a 1 in 72 chance of being a carrier of SMA. If Alroy Dust is found to have 2 or more copies of SMN1 on carrier screening, his risk of being a carrier is reduced but not eliminated. If both parents are carriers of SMA, there is a 1 in 4 (25%) chance of having an affected fetus with each pregnancy. We discussed that partner carrier screening for SMA is recommended to refine the couple's chance of having a child with SMA. The couple indicated that they are interested in pursuing partner carrier screening. ?  ?The couple had questions about treatments that exist for SMA. We discussed that several FDA-approved treatments that target the SMN1 and SMN2 genes are now available. Gillermina Phy is an SMA gene therapy administered via a one-time infusion that replaces the defective or missing SMN1 gene with a working copy. This new gene increases SMN protein levels, which improves motor neuron function and increases the likelihood of survival. Spinraza (AKA nusinersen) and Evrysdi both increase the amount of functional SMN protein produced by the SMN2 gene. ? ?Ms. Ringle was counseled regarding diagnostic testing via amniocentesis. We discussed the technical aspects of the procedure and quoted up to a 1 in 500 (0.2%) risk for preterm labor or other adverse pregnancy outcomes as a result of amniocentesis. Cultured cells from an amniocentesis sample allow for the visualization of a fetal karyotype, which can detect >99% of chromosomal aberrations. Chromosomal microarray can also be performed to identify smaller deletions or duplications of fetal chromosomal material. Amniocentesis could also assess for both SMN1 and SMN2 copy number  in the fetus to determine whether the fetus is affected by SMA. Ms. Apsey indicated that she may considering pursuing amniocentesis if her partner were identified as a carrier for SMA. Genetic counseling reviewed with the couple that SMA is included in [facility]'s newborn screening  program. ?  ?We also obtained a detailed family history and pregnancy history.  The couples stated that this is their first pregnancy.  Ms. Nguyen reported no complications or exposures to alcohol, tobacco or recreational drugs.  She is taking Effexor as prescribed as well as Zyrtec and vitamins.  Effexor has not been shown to increase the risk for birth defects, though the use of SSRI medication is associated with mild transient neonatal symptoms of neurologic, respiratory, motor and gastrointestinal systems. ? ?In the family history, the patient reported that her maternal aunt, maternal grandmother and several of her grandmothers sisters all have been diagnosed with breast cancer.  Her aunt, mother and herself have all had "BRCA genetic testing" which was negative.  I explained that when the genetic testing is negative in the affected family member, it does not exclude the chance that there is an inherited component, but tells [place] that there is not a change in the genes examined to explain the history.  I do not have documentation of the specific genes included in her testing to comment further.  We did review the importance of mammograms and self exams as directed by her physician given this history. There was also a significant family history of hypertension, diabetes, high cholesterol and asthma in multiple relatives.  We reviewed that these conditions are most often the result of a complex interaction of genetic as well as environmental and lifestyle factors.  The remainder of the family history was unremarkable for birth defects, developmental differences or known genetic conditions. ? ?Plan of care: ?Carrier screening drawn today on Celester Morgan (dob [birth year]) for CF, SMA and hemoglobinopathies. ?He requested I call Ronella with his results and we will make a plan as appropriate depending upon those results.  ?Given normal MaterniT21 results (we placed gender in envelope today), recommend AFP only in second  trimester and anatomy ultrasound as scheduled by her OB. ? ?We may be reached at [phone] with any questions or concerns. ? ?Wilburt Finlay, MS, CGC ? ?

## 2022-01-22 ENCOUNTER — Telehealth: Payer: Self-pay | Admitting: Obstetrics and Gynecology

## 2022-01-22 NOTE — Telephone Encounter (Signed)
Of note, this is copied from the medical record of Audrey Clarke, [birth year]b 06/20/1998, for documentation as it pertains to this current pregnancy.  He requested that I speak with his wife about the results, as described below. ? ?I spoke with Audrey Clarke, spouse of Audrey Clarke (as per his request), to review the results of his recent carrier screening and the implications for their ongoing pregnancy.  To review, carrier screening was offered due to the fact that his partner is known carrier for spinal muscular atrophy.  His results are available and are all within normal limits. He elected to have screening for the following conditions:  cystic fibrosis (CF), spinal muscular atrophy (SMA), hemoglobinopathies.  ? ?To review, CF is a genetic condition that occurs most often in Caucasian persons.  It primarily affects the lungs, digestive, and reproductive systems.  For someone to be at risk for having CF, both of their parents must be carriers for CF.  The testing can detect many persons who are carriers for CF and therefore determine if the pregnancy is at an increased risk for this condition.   The blood test results were negative when examined for the 97 most common mutations (or changes) in the gene for CF.  Testing for these 97 mutations detects approximately 81% of carriers who are African American.  Therefore, the chance that he is a carrier based on this negative result has been reduced from 1 in 61 to approximately 1 in 316.  Because this testing cannot detect all changes that may cause CF, we cannot eliminate the chance that this individual is a carrier completely. ? ?The results of the SMA carrier screening are also negative.  SMA is also a recessive genetic condition with variable age of onset and severity caused by mutations in the SMN1 gene.  This carrier testing assesses the number of copies of this gene.  Persons with one copy of the SMN1 gene are carriers, and those with no copies are affected with  the condition.  Individuals with two or more copies have a reduced chance to be a carrier.  Not all mutations can be detected with this testing, though it can detect 90.3% of carriers in the African American population.  The results revealed that Mr. Sforza has an SMN1 copy number of 2 and is negative for the c. *3+80T>G SNP, thus reducing his chance to be a carrier from 1 in 32 to 1 in 4,200.  Again, this testing cannot eliminate the chance to have a child with SMA, but dramatically reduces the chance.  With the knowledge that Kelita is a carrier, the chance for any of their children to have SMA is estimated to be 1 in 16,800 (1x1/4x1/4200). ? ?Mr. Gin also elected to have carrier screening for hemoglobinopathies performed. The results are within normal limits, showing normal adult hemoglobin (AA).  Given these results and his normal MCV (83.8), he is highly unlikely to be a carrier for a clinically significant hemoglobinopathy including sickle cell disease and thalassemias.  ? ?If there are any questions or concerns, please feel free to contact our office at [phone].   ? ? ?Cherly Anderson, MS, CGC ?

## 2022-01-24 ENCOUNTER — Ambulatory Visit (INDEPENDENT_AMBULATORY_CARE_PROVIDER_SITE_OTHER): Payer: 59 | Admitting: Obstetrics

## 2022-01-24 VITALS — BP 128/74

## 2022-01-24 DIAGNOSIS — Z3A14 14 weeks gestation of pregnancy: Secondary | ICD-10-CM

## 2022-01-24 DIAGNOSIS — Z3400 Encounter for supervision of normal first pregnancy, unspecified trimester: Secondary | ICD-10-CM

## 2022-01-24 NOTE — Progress Notes (Signed)
Routine Prenatal Care Visit ? ?Subjective  ?Audrey Clarke is a 31 y.o. G1P0000 at [redacted]w[redacted]d being seen today for ongoing prenatal care.  She is currently monitored for the following issues for this low-risk pregnancy and has Supervision of normal first pregnancy, antepartum; Anxiety with depression; Chronic migraine without aura without status migrainosus, not intractable; and Obesity affecting pregnancy on their problem list.  ?----------------------------------------------------------------------------------- ?Patient reports no complaints.   ? . Vag. Bleeding: None.   . Leaking Fluid denies.  ?----------------------------------------------------------------------------------- ?The following portions of the patient's history were reviewed and updated as appropriate: allergies, current medications, past family history, past medical history, past social history, past surgical history and problem list. Problem list updated. ? ?Objective  ?Blood pressure 128/74, last menstrual period 10/14/2021. ?Pregravid weight 239 lb (108.4 kg) Total Weight Gain -4 lb (-1.814 kg) ?Urinalysis: Urine Protein    Urine Glucose   ? ?Fetal Status:          ? ?General:  Alert, oriented and cooperative. Patient is in no acute distress.  ?Skin: Skin is warm and dry. No rash noted.   ?Cardiovascular: Normal heart rate noted  ?Respiratory: Normal respiratory effort, no problems with respiration noted  ?Abdomen: Soft, gravid, appropriate for gestational age. Pain/Pressure: Absent     ?Pelvic:  Cervical exam deferred        ?Extremities: Normal range of motion.     ?Mental Status: Normal mood and affect. Normal behavior. Normal judgment and thought content.  ? ?Assessment  ? ?31 y.o. G1P0000 at [redacted]w[redacted]d by  07/21/2022, by Last Menstrual Period presenting for routine prenatal visit ? ?Plan  ? ?pregnancy 1 Problems (from 12/12/21 to present)   ? Problem Noted Resolved  ? Obesity affecting pregnancy 12/27/2021 by Tresea Mall, CNM No  ? Supervision of  normal first pregnancy, antepartum 12/12/2021 by Natale Milch, MD No  ? Overview Addendum 01/24/2022 11:27 AM by Mirna Mires, CNM  ?   ?Nursing Staff Provider  ?Office Location  [facility] Dating    ?Language  English Anatomy US    ?Flu Vaccine   Genetic Screen  NIPS:   ?TDaP vaccine    Hgb A1C or  ?GTT Early : ?Third trimester :   ?Covid vaccinated   LAB RESULTS   ?Rhogam   Blood Type A/Negative/-- (02/15 1541)   ?Feeding Plan  Antibody Negative (02/15 1541)  ?Contraception  Rubella 1.59 (02/15 1541)  ?Circumcision  RPR Non Reactive (02/15 1541)   ?Pediatrician   HBsAg Negative (02/15 1541)   ?Support Person  HIV Non Reactive (02/15 1541)  ?Prenatal Classes  Varicella   ?  GBS  (For PCN allergy, check sensitivities)   ?BTL Consent     ?VBAC Consent  Pap  2022 NIL  ?  Hgb Electro    ?  CF   ?   SMA   ?     ? ? ?  ?  ?  ?  ? ?Preterm labor symptoms and general obstetric precautions including but not limited to vaginal bleeding, contractions, leaking of fluid and fetal movement were reviewed in detail with the patient. ?Please refer to After Visit Summary for other counseling recommendations.  ?Encouraged to take a perinatal class. Taking daily ASA. Anatomy scan ordered  For 18-[redacted] wks GA. Knows she is having a girl- no name yet. ? ?No follow-ups on file. ? ?Mirna Mires, CNM  ?01/24/2022 11:30 AM   ? ?

## 2022-01-24 NOTE — Progress Notes (Signed)
No vb. No lof.  

## 2022-02-21 ENCOUNTER — Ambulatory Visit
Admission: RE | Admit: 2022-02-21 | Discharge: 2022-02-21 | Disposition: A | Discharge status: Home / Self Care | Payer: 59 | Source: Ambulatory Visit | Attending: Obstetrics | Admitting: Obstetrics

## 2022-02-21 ENCOUNTER — Ambulatory Visit (INDEPENDENT_AMBULATORY_CARE_PROVIDER_SITE_OTHER): Payer: 59 | Admitting: Licensed Practical Nurse

## 2022-02-21 ENCOUNTER — Encounter: Payer: Self-pay | Admitting: Licensed Practical Nurse

## 2022-02-21 VITALS — BP 122/62 | Wt 235.0 lb

## 2022-02-21 DIAGNOSIS — Z3A18 18 weeks gestation of pregnancy: Secondary | ICD-10-CM | POA: Diagnosis not present

## 2022-02-21 DIAGNOSIS — Z3400 Encounter for supervision of normal first pregnancy, unspecified trimester: Secondary | ICD-10-CM

## 2022-02-21 DIAGNOSIS — Z3689 Encounter for other specified antenatal screening: Secondary | ICD-10-CM | POA: Insufficient documentation

## 2022-02-22 NOTE — Progress Notes (Signed)
Routine Prenatal Care Visit ? ?Subjective  ?Audrey Clarke is a 31 y.o. G1P0000 at [redacted]w[redacted]d being seen today for ongoing prenatal care.  She is currently monitored for the following issues for this low-risk pregnancy and has Supervision of normal first pregnancy, antepartum; Anxiety with depression; Chronic migraine without aura without status migrainosus, not intractable; and Obesity affecting pregnancy on their problem list.  ?----------------------------------------------------------------------------------- ?Patient reports no complaints.  Here with Thedore Mins  ?Contractions: Not present. Vag. Bleeding: None.  Movement: Present. Leaking Fluid denies.  ?----------------------------------------------------------------------------------- ?The following portions of the patient's history were reviewed and updated as appropriate: allergies, current medications, past family history, past medical history, past social history, past surgical history and problem list. Problem list updated. ? ?Objective  ?Blood pressure 122/62, weight 235 lb (106.6 kg), last menstrual period 10/14/2021. ?Pregravid weight 239 lb (108.4 kg) Total Weight Gain -4 lb (-1.814 kg) ?Urinalysis: Urine Protein    Urine Glucose   ? ?Fetal Status: Fetal Heart Rate (bpm): 140   Movement: Present    ? ?General:  Alert, oriented and cooperative. Patient is in no acute distress.  ?Skin: Skin is warm and dry. No rash noted.   ?Cardiovascular: Normal heart rate noted  ?Respiratory: Normal respiratory effort, no problems with respiration noted  ?Abdomen: Soft, gravid, appropriate for gestational age. Pain/Pressure: Absent     ?Pelvic:  Cervical exam deferred        ?Extremities: Normal range of motion.  Edema: None  ?Mental Status: Normal mood and affect. Normal behavior. Normal judgment and thought content.  ? ?Assessment  ? ?31 y.o. G1P0000 at [redacted]w[redacted]d by  07/21/2022, by Last Menstrual Period presenting for routine prenatal visit ? ?Plan  ? ?pregnancy 1 Problems (from  12/12/21 to present)   ? ? Problem Noted Resolved  ? Obesity affecting pregnancy 12/27/2021 by Rod Can, CNM No  ? Supervision of normal first pregnancy, antepartum 12/12/2021 by Homero Fellers, MD No  ? Overview Addendum 02/21/2022 10:38 AM by Allen Derry, CNM  ?   ?Nursing Staff Provider  ?Office Location  [facility] Dating  [redacted]w[redacted]d on 2/27   ?Language  English Anatomy US    ?Flu Vaccine   Genetic Screen  NIPS: neg, female   ?TDaP vaccine    Hgb A1C or  ?GTT Early : ?Third trimester :   ?Covid vaccinated   LAB RESULTS   ?Rhogam   Blood Type A/Negative/-- (02/15 1541)   ?Feeding Plan  Antibody Negative (02/15 1541)  ?Contraception  Rubella 1.59 (02/15 1541)  ?Circumcision  RPR Non Reactive (02/15 1541)   ?Pediatrician   HBsAg Negative (02/15 1541)   ?Support Person Thedore Mins  HIV Non Reactive (02/15 1541)  ?Prenatal Classes  Varicella immune  ?  GBS  (For PCN allergy, check sensitivities)   ?BTL Consent     ?VBAC Consent  Pap  2022 NIL  ?  Hgb Electro    ?  CF   ?   SMA   ?     ? ? ?  ?  ? ?  ?  ? general obstetric precautions including but not limited to vaginal bleeding, contractions, leaking of fluid and fetal movement were reviewed in detail with the patient. ?Please refer to After Visit Summary for other counseling recommendations.  ? ?Return in about 4 weeks (around 03/21/2022) for [place]. ? ?Has anatomy US later today  ? ?Roberto Scales, CNM  ?Mosetta Pigeon, [employer]  ?02/22/22  ?4:02 PM  ?  ? ?

## 2022-02-25 ENCOUNTER — Other Ambulatory Visit: Payer: Self-pay | Admitting: Obstetrics

## 2022-02-25 DIAGNOSIS — Z3400 Encounter for supervision of normal first pregnancy, unspecified trimester: Secondary | ICD-10-CM

## 2022-03-11 DIAGNOSIS — Z803 Family history of malignant neoplasm of breast: Secondary | ICD-10-CM | POA: Insufficient documentation

## 2022-03-13 ENCOUNTER — Ambulatory Visit
Admission: RE | Admit: 2022-03-13 | Discharge: 2022-03-13 | Disposition: A | Discharge status: Home / Self Care | Payer: 59 | Source: Ambulatory Visit | Attending: Obstetrics | Admitting: Obstetrics

## 2022-03-13 DIAGNOSIS — Z3A22 22 weeks gestation of pregnancy: Secondary | ICD-10-CM | POA: Diagnosis not present

## 2022-03-13 DIAGNOSIS — Z3689 Encounter for other specified antenatal screening: Secondary | ICD-10-CM | POA: Diagnosis present

## 2022-03-13 DIAGNOSIS — Z3400 Encounter for supervision of normal first pregnancy, unspecified trimester: Secondary | ICD-10-CM | POA: Insufficient documentation

## 2022-03-17 ENCOUNTER — Other Ambulatory Visit: Payer: Self-pay | Admitting: Obstetrics

## 2022-03-17 DIAGNOSIS — Z3400 Encounter for supervision of normal first pregnancy, unspecified trimester: Secondary | ICD-10-CM

## 2022-03-20 ENCOUNTER — Ambulatory Visit (INDEPENDENT_AMBULATORY_CARE_PROVIDER_SITE_OTHER): Payer: 59 | Admitting: Obstetrics and Gynecology

## 2022-03-20 ENCOUNTER — Encounter: Payer: Self-pay | Admitting: Obstetrics and Gynecology

## 2022-03-20 VITALS — BP 124/70 | Wt 240.8 lb

## 2022-03-20 DIAGNOSIS — Z3400 Encounter for supervision of normal first pregnancy, unspecified trimester: Secondary | ICD-10-CM

## 2022-03-20 DIAGNOSIS — G43709 Chronic migraine without aura, not intractable, without status migrainosus: Secondary | ICD-10-CM

## 2022-03-20 DIAGNOSIS — O26899 Other specified pregnancy related conditions, unspecified trimester: Secondary | ICD-10-CM | POA: Insufficient documentation

## 2022-03-20 DIAGNOSIS — Z6791 Unspecified blood type, Rh negative: Secondary | ICD-10-CM

## 2022-03-20 DIAGNOSIS — O99212 Obesity complicating pregnancy, second trimester: Secondary | ICD-10-CM

## 2022-03-20 LAB — POCT URINALYSIS DIPSTICK OB
Glucose, UA: NEGATIVE
POC,PROTEIN,UA: NEGATIVE

## 2022-03-20 NOTE — Progress Notes (Signed)
   PRENATAL VISIT NOTE  Subjective:  Audrey Clarke is a 31 y.o. G1P0000 at [redacted]w[redacted]d being seen today for ongoing prenatal care.  She is currently monitored for the following issues for this low-risk pregnancy and has Supervision of normal first pregnancy, antepartum; Anxiety with depression; Chronic migraine without aura without status migrainosus, not intractable; Obesity affecting pregnancy; and Rh negative state in antepartum period on their problem list.  Patient reports no complaints.  Contractions: Not present. Vag. Bleeding: None.  Movement: Present. Denies leaking of fluid.   The following portions of the patient's history were reviewed and updated as appropriate: allergies, current medications, past family history, past medical history, past social history, past surgical history and problem list.   Objective:   Vitals:   03/20/22 1046  BP: 124/70  Weight: 240 lb 12.8 oz (109.2 kg)    Fetal Status: Fetal Heart Rate (bpm): 145 Fundal Height: 22 cm Movement: Present     General:  Alert, oriented and cooperative. Patient is in no acute distress.  Skin: Skin is warm and dry. No rash noted.   Cardiovascular: Normal heart rate noted  Respiratory: Normal respiratory effort, no problems with respiration noted  Abdomen: Soft, gravid, appropriate for gestational age.  Pain/Pressure: Present     Pelvic: Cervical exam deferred        Extremities: Normal range of motion.     Mental Status: Normal mood and affect. Normal behavior. Normal judgment and thought content.   Assessment and Plan:  Pregnancy: G1P0000 at [redacted]w[redacted]d 1. Supervision of normal first pregnancy, antepartum Patient is doing well without complaints Follow up ultrasound to complete anatomy previously ordered- patient plans to schedule today  2. Chronic migraine without aura without status migrainosus, not intractable Stable  3. Obesity affecting pregnancy in second trimester   4. Rh negative state in antepartum  period Rhogam at 28 weeks  Preterm labor symptoms and general obstetric precautions including but not limited to vaginal bleeding, contractions, leaking of fluid and fetal movement were reviewed in detail with the patient. Please refer to After Visit Summary for other counseling recommendations.   Return in about 4 weeks (around 04/17/2022) for in person, ROB, Low risk, glucola.  No future appointments.  Catalina Antigua, MD

## 2022-03-20 NOTE — Addendum Note (Signed)
Addended by: Fonda Kinder on: 03/20/2022 01:22 PM   Modules accepted: Orders

## 2022-04-17 ENCOUNTER — Ambulatory Visit
Admission: RE | Admit: 2022-04-17 | Discharge: 2022-04-17 | Disposition: A | Discharge status: Home / Self Care | Payer: 59 | Source: Ambulatory Visit | Attending: Obstetrics | Admitting: Obstetrics

## 2022-04-17 DIAGNOSIS — Z3689 Encounter for other specified antenatal screening: Secondary | ICD-10-CM | POA: Diagnosis present

## 2022-04-17 DIAGNOSIS — Z3400 Encounter for supervision of normal first pregnancy, unspecified trimester: Secondary | ICD-10-CM | POA: Insufficient documentation

## 2022-04-17 DIAGNOSIS — Z3A27 27 weeks gestation of pregnancy: Secondary | ICD-10-CM | POA: Diagnosis not present

## 2022-04-22 ENCOUNTER — Ambulatory Visit (INDEPENDENT_AMBULATORY_CARE_PROVIDER_SITE_OTHER): Payer: 59 | Admitting: Family Medicine

## 2022-04-22 ENCOUNTER — Other Ambulatory Visit: Payer: 59

## 2022-04-22 VITALS — BP 122/68 | Wt 243.0 lb

## 2022-04-22 DIAGNOSIS — O26899 Other specified pregnancy related conditions, unspecified trimester: Secondary | ICD-10-CM

## 2022-04-22 DIAGNOSIS — Z23 Encounter for immunization: Secondary | ICD-10-CM | POA: Diagnosis not present

## 2022-04-22 DIAGNOSIS — Z3400 Encounter for supervision of normal first pregnancy, unspecified trimester: Secondary | ICD-10-CM

## 2022-04-22 DIAGNOSIS — O99810 Abnormal glucose complicating pregnancy: Secondary | ICD-10-CM

## 2022-04-22 DIAGNOSIS — Z3A27 27 weeks gestation of pregnancy: Secondary | ICD-10-CM

## 2022-04-22 DIAGNOSIS — Z6791 Unspecified blood type, Rh negative: Secondary | ICD-10-CM

## 2022-04-22 DIAGNOSIS — O99213 Obesity complicating pregnancy, third trimester: Secondary | ICD-10-CM

## 2022-04-22 LAB — POCT URINALYSIS DIPSTICK OB
Glucose, UA: NEGATIVE
POC,PROTEIN,UA: NEGATIVE

## 2022-04-22 MED ORDER — RHO D IMMUNE GLOBULIN 1500 UNIT/2ML IJ SOSY
300.0000 ug | PREFILLED_SYRINGE | Freq: Once | INTRAMUSCULAR | Status: AC
  Administered 2022-04-22: 300 ug via INTRAMUSCULAR

## 2022-04-22 NOTE — Progress Notes (Signed)
   PRENATAL VISIT NOTE  Subjective:  Audrey Clarke is a 31 y.o. G1P0000 at [redacted]w[redacted]d being seen today for ongoing prenatal care.  She is currently monitored for the following issues for this high-risk pregnancy and has Supervision of normal first pregnancy, antepartum; Anxiety with depression; Chronic migraine without aura without status migrainosus, not intractable; Obesity affecting pregnancy; and Rh negative state in antepartum period on their problem list.  Patient reports heartburn.  Contractions: Not present. Vag. Bleeding: None.  Movement: Present. Denies leaking of fluid.   The following portions of the patient's history were reviewed and updated as appropriate: allergies, current medications, past family history, past medical history, past social history, past surgical history and problem list.   Objective:   Vitals:   04/22/22 0842  BP: 122/68  Weight: 243 lb (110.2 kg)    Fetal Status: Fetal Heart Rate (bpm): 145 Fundal Height: 28 cm Movement: Present     General:  Alert, oriented and cooperative. Patient is in no acute distress.  Skin: Skin is warm and dry. No rash noted.   Cardiovascular: Normal heart rate noted  Respiratory: Normal respiratory effort, no problems with respiration noted  Abdomen: Soft, gravid, appropriate for gestational age.  Pain/Pressure: Present     Pelvic: Cervical exam deferred        Extremities: Normal range of motion.  Edema: None  Mental Status: Normal mood and affect. Normal behavior. Normal judgment and thought content.   Assessment and Plan:  Pregnancy: G1P0000 at [redacted]w[redacted]d 1. [redacted] weeks gestation of pregnancy - 28 Weeks RH-Panel - POC Urinalysis Dipstick OB  2. Supervision of normal first pregnancy, antepartum Repeat US  on 6/21 was WNL Having GERD- discussed use of H2 blocker and tums Discussed timing of family members getting TDAP Reviewed cadence change of visits today - rho (d) immune globulin (RHIG/RHOPHYLAC) injection 300 mcg - Tdap  vaccine greater than or equal to 7yo IM - POC Urinalysis Dipstick OB  3. Rh negative state in antepartum period Rhogam today - rho (d) immune globulin (RHIG/RHOPHYLAC) injection 300 mcg - 28 Weeks RH-Panel  4. Obesity affecting pregnancy in third trimester TWG=4 lb (1.814 kg)   Preterm labor symptoms and general obstetric precautions including but not limited to vaginal bleeding, contractions, leaking of fluid and fetal movement were reviewed in detail with the patient. Please refer to After Visit Summary for other counseling recommendations.   Return in about 2 weeks (around 05/06/2022) for Routine prenatal care.  Future Appointments  Date Time Provider Department Center  05/06/2022  9:55 AM Allie Bossier, MD [facility] None  05/24/2022  8:55 AM Dominic, Courtney Heys, CNM [facility] None  06/07/2022  8:55 AM Mirna Mires, CNM [facility] None  06/20/2022  8:55 AM Mirna Mires, CNM [facility] None    Federico Flake, MD

## 2022-04-23 LAB — 28 WEEKS RH-PANEL
Antibody Screen: NEGATIVE
Basophils Absolute: 0 10*3/uL (ref 0.0–0.2)
Basos: 0 %
EOS (ABSOLUTE): 0 10*3/uL (ref 0.0–0.4)
Eos: 0 %
Gestational Diabetes Screen: 137 mg/dL (ref 70–139)
HIV Screen 4th Generation wRfx: NONREACTIVE
Hematocrit: 30.9 % — ABNORMAL LOW (ref 34.0–46.6)
Hemoglobin: 10.6 g/dL — ABNORMAL LOW (ref 11.1–15.9)
Immature Grans (Abs): 0.1 10*3/uL (ref 0.0–0.1)
Immature Granulocytes: 1 %
Lymphocytes Absolute: 1.2 10*3/uL (ref 0.7–3.1)
Lymphs: 16 %
MCH: 32.6 pg (ref 26.6–33.0)
MCHC: 34.3 g/dL (ref 31.5–35.7)
MCV: 95 fL (ref 79–97)
Monocytes Absolute: 0.3 10*3/uL (ref 0.1–0.9)
Monocytes: 4 %
Neutrophils Absolute: 5.7 10*3/uL (ref 1.4–7.0)
Neutrophils: 79 %
Platelets: 168 10*3/uL (ref 150–450)
RBC: 3.25 x10E6/uL — ABNORMAL LOW (ref 3.77–5.28)
RDW: 13.1 % (ref 11.7–15.4)
RPR Ser Ql: NONREACTIVE
WBC: 7.3 10*3/uL (ref 3.4–10.8)

## 2022-04-24 DIAGNOSIS — O99810 Abnormal glucose complicating pregnancy: Secondary | ICD-10-CM | POA: Insufficient documentation

## 2022-04-24 NOTE — Addendum Note (Signed)
Addended by: Geanie Berlin on: 04/24/2022 04:44 PM   Modules accepted: Orders

## 2022-04-24 NOTE — Progress Notes (Signed)
MyChart msg sent about 1hr GTT.

## 2022-04-25 ENCOUNTER — Telehealth: Payer: Self-pay

## 2022-04-25 ENCOUNTER — Telehealth: Payer: Self-pay | Admitting: Obstetrics & Gynecology

## 2022-04-25 NOTE — Telephone Encounter (Signed)
-----   Message from Loran Senters, [place] sent at 04/24/2022  4:51 PM EDT ----- Please schedule pt for a 3h GTT.  MyChart msg was sent to her telling her she needs to schedule it.  Thanks.

## 2022-04-25 NOTE — Telephone Encounter (Signed)
I contacted patient via phone. I left message for patient to call back to be scheduled. 

## 2022-04-25 NOTE — Telephone Encounter (Signed)
Patient is schedule for 04/29/22 for 3 gtt.

## 2022-04-25 NOTE — Telephone Encounter (Signed)
Audrey Clarke called triage line this morning she is asking what mg of the Famotidine should she take for her heart burn?

## 2022-04-26 NOTE — Telephone Encounter (Signed)
Left detailed voice message on answering machine advising her to to take Famotidine 20mg  BID, patient advised that If she has any further questions to reach back out to our office. KW

## 2022-04-29 ENCOUNTER — Other Ambulatory Visit: Payer: 59

## 2022-04-29 DIAGNOSIS — O99213 Obesity complicating pregnancy, third trimester: Secondary | ICD-10-CM

## 2022-04-29 DIAGNOSIS — O99810 Abnormal glucose complicating pregnancy: Secondary | ICD-10-CM

## 2022-04-30 LAB — GESTATIONAL GLUCOSE TOLERANCE
Glucose, Fasting: 101 mg/dL — ABNORMAL HIGH (ref 70–94)
Glucose, GTT - 1 Hour: 173 mg/dL (ref 70–179)
Glucose, GTT - 2 Hour: 112 mg/dL (ref 70–154)
Glucose, GTT - 3 Hour: 148 mg/dL — ABNORMAL HIGH (ref 70–139)

## 2022-05-01 ENCOUNTER — Encounter: Payer: Self-pay | Admitting: Family Medicine

## 2022-05-01 DIAGNOSIS — O24419 Gestational diabetes mellitus in pregnancy, unspecified control: Secondary | ICD-10-CM | POA: Insufficient documentation

## 2022-05-03 ENCOUNTER — Other Ambulatory Visit: Payer: Self-pay

## 2022-05-03 ENCOUNTER — Other Ambulatory Visit: Payer: Self-pay | Admitting: Advanced Practice Midwife

## 2022-05-03 DIAGNOSIS — O24410 Gestational diabetes mellitus in pregnancy, diet controlled: Secondary | ICD-10-CM

## 2022-05-03 DIAGNOSIS — O24419 Gestational diabetes mellitus in pregnancy, unspecified control: Secondary | ICD-10-CM

## 2022-05-03 MED ORDER — ACCU-CHEK SOFTCLIX LANCETS MISC: 12 refills | Status: DC

## 2022-05-03 MED ORDER — ACCU-CHEK AVIVA PLUS W/DEVICE KIT: PACK | 0 refills | Status: DC

## 2022-05-03 MED ORDER — ACCU-CHEK AVIVA PLUS VI STRP: ORAL_STRIP | 12 refills | Status: DC

## 2022-05-03 NOTE — Progress Notes (Signed)
Accu-chek glucometer supplies sent for patient. Referral to [facility] sent by [employer].

## 2022-05-06 ENCOUNTER — Ambulatory Visit (INDEPENDENT_AMBULATORY_CARE_PROVIDER_SITE_OTHER): Payer: 59 | Admitting: Obstetrics & Gynecology

## 2022-05-06 VITALS — BP 120/80 | Wt 242.0 lb

## 2022-05-06 DIAGNOSIS — O24410 Gestational diabetes mellitus in pregnancy, diet controlled: Secondary | ICD-10-CM

## 2022-05-06 DIAGNOSIS — O26899 Other specified pregnancy related conditions, unspecified trimester: Secondary | ICD-10-CM

## 2022-05-06 DIAGNOSIS — E669 Obesity, unspecified: Secondary | ICD-10-CM

## 2022-05-06 DIAGNOSIS — Z3A29 29 weeks gestation of pregnancy: Secondary | ICD-10-CM

## 2022-05-06 DIAGNOSIS — O99213 Obesity complicating pregnancy, third trimester: Secondary | ICD-10-CM

## 2022-05-06 DIAGNOSIS — O360190 Maternal care for anti-D [Rh] antibodies, unspecified trimester, not applicable or unspecified: Secondary | ICD-10-CM

## 2022-05-06 LAB — POCT URINALYSIS DIPSTICK OB
Glucose, UA: NEGATIVE
POC,PROTEIN,UA: NEGATIVE

## 2022-05-06 LAB — GLUCOSE, POCT (MANUAL RESULT ENTRY): POC Glucose: 122 mg/dl — AB (ref 70–99)

## 2022-05-06 MED ORDER — FREESTYLE LIBRE 3 SENSOR MISC: 12 refills | Status: DC

## 2022-05-06 NOTE — Progress Notes (Signed)
Subjective:    Audrey Clarke is a 31 y.o. married G1 (female Isabelle Course) being seen today for her obstetrical visit. She is at [redacted]w[redacted]d gestation. Patient reports no complaints. Fetal movement: normal. She would like a Freestyle Libre to check her sugars as she works as a [employer] feel that she has time to check her sugars while at work.   Review of Systems:   Review of Systems   Objective:    BP 120/80   Wt 242 lb (109.8 kg)   LMP 10/14/2021   BMI 39.06 kg/m   Physical Exam  Exam Well nourished, well hydrated White female, no apparent distress She is ambulating and conversing normally.   FHT:  130s BPM  Uterine Size: 30 cm  Presentation: N/a   RBS today is 122 via fingerstick.  Assessment:    Pregnancy:  G1P0000    Plan:    Patient Active Problem List   Diagnosis Date Noted   GDM (gestational diabetes mellitus) 05/01/2022   Abnormal glucose tolerance in pregnancy 04/24/2022   Rh negative state in antepartum period 03/20/2022   Obesity affecting pregnancy 12/27/2021   Supervision of normal first pregnancy, antepartum 12/12/2021   Anxiety with depression 10/25/2019   Chronic migraine without aura without status migrainosus, not intractable 10/25/2019   I have prescribed the freestyle Ericson as requested. If this doesn't get approved, then she agrees to get the regular glucometer and make the time to check her sugars.  She will go to the diabetes counseling as rec'd. She has her next 3 visits already scheduled.

## 2022-05-13 ENCOUNTER — Encounter: Payer: 59 | Attending: Family Medicine | Admitting: *Deleted

## 2022-05-13 ENCOUNTER — Encounter: Payer: Self-pay | Admitting: *Deleted

## 2022-05-13 VITALS — BP 114/78 | Ht 66.0 in | Wt 240.6 lb

## 2022-05-13 DIAGNOSIS — Z713 Dietary counseling and surveillance: Secondary | ICD-10-CM | POA: Diagnosis not present

## 2022-05-13 DIAGNOSIS — Z3A00 Weeks of gestation of pregnancy not specified: Secondary | ICD-10-CM | POA: Insufficient documentation

## 2022-05-13 DIAGNOSIS — O24410 Gestational diabetes mellitus in pregnancy, diet controlled: Secondary | ICD-10-CM | POA: Insufficient documentation

## 2022-05-13 NOTE — Patient Instructions (Signed)
Read booklet on Gestational Diabetes Follow Gestational Meal Planning Guidelines Don't delay meals longer than 5 hrs - ad a snack with 1 protein and 1 carbohydrate serving Complete a 3 Day Food Record and bring to next appointment Check blood sugars 4 x day - before breakfast and 2 hrs after every meal and record  Bring blood sugar logs/phone to all appointments Purchase urine ketone strips if instructed by MD and check urine ketones every am:  If + increase bedtime snack to 1 protein and 2 carbohydrate servings Walk 20-30 minutes at least 5 x week if permitted by MD

## 2022-05-13 NOTE — Progress Notes (Signed)
Diabetes Self-Management Education  Visit Type: First/Initial  Appt. Start Time: 0830 Appt. End Time: 0950  05/13/2022  Audrey Clarke, identified by name and date of birth, is a 31 y.o. female with a diagnosis of Diabetes: Gestational Diabetes.   ASSESSMENT  Blood pressure 114/78, height 5\' 6"  (1.676 m), weight 240 lb 9.6 oz (109.1 kg), last menstrual period 10/14/2021, estimated date of delivery 07/21/2022 Body mass index is 38.83 kg/m.   Diabetes Self-Management Education - 05/13/22 1005       Visit Information   Visit Type First/Initial      Initial Visit   Diabetes Type Gestational Diabetes    Date Diagnosed "couple weeks"    Are you currently following a meal plan? No    Are you taking your medications as prescribed? Yes      Health Coping   How would you rate your overall health? Good      Psychosocial Assessment   Patient Belief/Attitude about Diabetes Other (comment)   "nervous'   What is the hardest part about your diabetes right now, causing you the most concern, or is the most worrisome to you about your diabetes?   Making healty food and beverage choices    Self-care barriers None    Self-management support Doctor's office;Family    Patient Concerns Nutrition/Meal planning;Monitoring;Healthy Lifestyle    Special Needs None    Preferred Learning Style Visual;Hands on   talking/discussion   Learning Readiness Change in progress    How often do you need to have someone help you when you read instructions, pamphlets, or other written materials from your doctor or pharmacy? 1 - Never    What is the last grade level you completed in school? high school      Pre-Education Assessment   Patient understands the diabetes disease and treatment process. Needs Instruction    Patient understands incorporating nutritional management into lifestyle. Needs Instruction    Patient undertands incorporating physical activity into lifestyle. Needs Instruction    Patient  understands using medications safely. Needs Instruction    Patient understands monitoring blood glucose, interpreting and using results Comprehends key points    Patient understands prevention, detection, and treatment of acute complications. Needs Instruction    Patient understands prevention, detection, and treatment of chronic complications. Needs Instruction    Patient understands how to develop strategies to address psychosocial issues. Needs Instruction    Patient understands how to develop strategies to promote health/change behavior. Needs Instruction      Complications   Last HgB A1C per patient/outside source 5.3 %   12/27/2021   How often do you check your blood sugar? 3-4 times/day   She is using 02/26/2022.   Fasting Blood glucose range (mg/dL) [employer]   FBG's 42-353 mg/dL 3/7 elevated   Postprandial Blood glucose range (mg/dL) 5/7   pp breakfast 83-123 mg/dL 1/6 elevated; pp lunch 93-136 mg/dL 2/6 elevated; pp supper 89-135 mg/dL 1/5 elevated   Have you had a dilated eye exam in the past 12 months? No    Have you had a dental exam in the past 12 months? No    Are you checking your feet? Yes    How many days per week are you checking your feet? 7      Dietary Intake   Breakfast smoothies with berries, cia seeds and little milk; 15-400;867-619 yogurt with berries and cereal    Lunch left overs or sandwich (bacon, cheese, tomato)    Dinner chicken, little  beef; sweet potatoes, corn, occasional pasta, green beans, broccoli, brussel sprouts, zucchini; salads with spinach, carrots, broccoli, tomatoes, cuccumberes    Beverage(s) water      Activity / Exercise   Activity / Exercise Type Light (walking / raking leaves)   walking/swimming/biking   How many days per week do you exercise? 2.5    How many minutes per day do you exercise? 22.5    Total minutes per week of exercise 56.25      Patient Education   Previous Diabetes Education No    Disease Pathophysiology Definition  of diabetes, type 1 and 2, and the diagnosis of diabetes;Factors that contribute to the development of diabetes;Explored patient's options for treatment of their diabetes    Healthy Eating Role of diet in the treatment of diabetes and the relationship between the three main macronutrients and blood glucose level;Food label reading, portion sizes and measuring food.;Reviewed blood glucose goals for pre and post meals and how to evaluate the patients' food intake on their blood glucose level.    Being Active Role of exercise on diabetes management, blood pressure control and cardiac health.    Medications Other (comment)   Limited use of oral medications during pregnancy and potental for insulin.   Monitoring Taught/evaluated CGM (comment);Taught/discussed recording of test results and interpretation of SMBG.;Identified appropriate SMBG and/or A1C goals.;Ketone testing, when, how.    Chronic complications Relationship between chronic complications and blood glucose control    Diabetes Stress and Support Identified and addressed patients feelings and concerns about diabetes    Preconception care Pregnancy and GDM  Role of pre-pregnancy blood glucose control on the development of the fetus;Reviewed with patient blood glucose goals with pregnancy;Role of family planning for patients with diabetes      Individualized Goals (developed by patient)   Reducing Risk Other (comment)   prevent diabetes complications; lead a healthier lifestyle     Outcomes   Expected Outcomes Demonstrated interest in learning. Expect positive outcomes    Future DMSE 2 wks         Individualized Plan for Diabetes Self-Management Training:   Learning Objective:  Patient will have a greater understanding of diabetes self-management. Patient education plan is to attend individual and/or group sessions per assessed needs and concerns.   Plan:   Patient Instructions  Read booklet on Gestational Diabetes Follow Gestational  Meal Planning Guidelines Don't delay meals longer than 5 hrs - ad a snack with 1 protein and 1 carbohydrate serving Complete a 3 Day Food Record and bring to next appointment Check blood sugars 4 x day - before breakfast and 2 hrs after every meal and record  Bring blood sugar logs/phone to all appointments Purchase urine ketone strips if instructed by MD and check urine ketones every am:  If + increase bedtime snack to 1 protein and 2 carbohydrate servings Walk 20-30 minutes at least 5 x week if permitted by MD  Expected Outcomes:  Demonstrated interest in learning. Expect positive outcomes  Education material provided:  Gestational Booklet Gestational Meal Planning Guidelines Simple Meal Plan 3 Day Food Record Goals for a Healthy Pregnancy   If problems or questions, patient to contact team via:   Sharion Settler, RN, CCM, CDCES [phone]  Future DSME appointment: 2 wks May 29, 2022 with the dietitian

## 2022-05-24 ENCOUNTER — Ambulatory Visit (INDEPENDENT_AMBULATORY_CARE_PROVIDER_SITE_OTHER): Payer: 59 | Admitting: Licensed Practical Nurse

## 2022-05-24 ENCOUNTER — Encounter: Payer: Self-pay | Admitting: Licensed Practical Nurse

## 2022-05-24 VITALS — BP 120/80 | Wt 240.8 lb

## 2022-05-24 DIAGNOSIS — O24410 Gestational diabetes mellitus in pregnancy, diet controlled: Secondary | ICD-10-CM

## 2022-05-24 DIAGNOSIS — Z3A31 31 weeks gestation of pregnancy: Secondary | ICD-10-CM

## 2022-05-24 DIAGNOSIS — O0990 Supervision of high risk pregnancy, unspecified, unspecified trimester: Secondary | ICD-10-CM

## 2022-05-24 LAB — POCT URINALYSIS DIPSTICK OB
Glucose, UA: NEGATIVE
POC,PROTEIN,UA: NEGATIVE

## 2022-05-24 NOTE — Progress Notes (Signed)
Patient reports that she had noticed what she believes was a light pink discharge a week ago but reports that she has been suffering with constipation and states that issue has no resolved.

## 2022-05-24 NOTE — Progress Notes (Signed)
Routine Prenatal Care Visit  Subjective  Audrey Clarke is a 31 y.o. G1P0000 at [redacted]w[redacted]d being seen today for ongoing prenatal care.  She is currently monitored for the following issues for this high-risk pregnancy and has Supervision of normal first pregnancy, antepartum; Anxiety with depression; Chronic migraine without aura without status migrainosus, not intractable; Obesity affecting pregnancy; Rh negative state in antepartum period; Abnormal glucose tolerance in pregnancy; and GDM (gestational diabetes mellitus) on their problem list.  ----------------------------------------------------------------------------------- Patient reports constipation.  Doing well. Using libre meter to monitor blood glucose, it is currently off as she needs a new sticker. Did not bring in log, states she she had her Diabetes consult, the person there told her she was doing a good job. Reports her fasting's are 90-105, lunch sometimes is 135, Dinner's are always less than 120. -trying to find CBE but life is hectic right now -had an episode of light pink discharge last week, but also was constipated, has not seen it since.  Has increased her Miralax, which seems to help.  Contractions: Not present. Vag. Bleeding: None.  Movement: Present. Leaking Fluid denies.  ----------------------------------------------------------------------------------- The following portions of the patient's history were reviewed and updated as appropriate: allergies, current medications, past family history, past medical history, past social history, past surgical history and problem list. Problem list updated.  Objective  Blood pressure 120/80, weight 240 lb 12.8 oz (109.2 kg), last menstrual period 10/14/2021. Pregravid weight 239 lb (108.4 kg) Total Weight Gain 1 lb 12.8 oz (0.816 kg) Urinalysis: Urine Protein Negative  Urine Glucose Negative  Fetal Status: Fetal Heart Rate (bpm): 148 Fundal Height: 31 cm Movement: Present     General:   Alert, oriented and cooperative. Patient is in no acute distress.  Skin: Skin is warm and dry. No rash noted.   Cardiovascular: Normal heart rate noted  Respiratory: Normal respiratory effort, no problems with respiration noted  Abdomen: Soft, gravid, appropriate for gestational age. Pain/Pressure: Present     Pelvic:  Cervical exam deferred        Extremities: Normal range of motion.  Edema: Trace  Mental Status: Normal mood and affect. Normal behavior. Normal judgment and thought content.   Assessment   30 y.o. G1P0000 at [redacted]w[redacted]d by  07/21/2022, by Last Menstrual Period presenting for routine prenatal visit  Plan   pregnancy 1 Problems (from 12/12/21 to present)     Problem Noted Resolved   Rh negative state in antepartum period 03/20/2022 by Catalina Antigua, MD No   Obesity affecting pregnancy 12/27/2021 by Tresea Mall, CNM No   Supervision of normal first pregnancy, antepartum 12/12/2021 by Natale Milch, MD No   Overview Addendum 04/24/2022  5:15 PM by Mirna Mires, CNM     Nursing Staff Provider  Office Location  [facility] Dating  [redacted]w[redacted]d on 2/27   Language  English Anatomy US  Incomplete views 4/28, repeated nml  Flu Vaccine   Genetic Screen  NIPS: neg, female   TDaP vaccine   04/22/22 Hgb A1C or  GTT Early : Third trimester :   Covid vaccinated   LAB RESULTS   Rhogam  04/22/22 Blood Type A/Negative/-- (02/15 1541)   Feeding Plan  Antibody Negative (02/15 1541)  Contraception  Rubella 1.59 (02/15 1541)  Circumcision  RPR Non Reactive (02/15 1541)   Pediatrician   HBsAg Negative (02/15 1541)   Support Person Zach  HIV Non Reactive (02/15 1541)  Prenatal Classes  Varicella immune    GBS  (For PCN  allergy, check sensitivities)   BTL Consent     VBAC Consent  Pap  2022 NIL    Hgb Electro      CF      SMA                    Preterm labor symptoms and general obstetric precautions including but not limited to vaginal bleeding, contractions, leaking of fluid  and fetal movement were reviewed in detail with the patient. Please refer to After Visit Summary for other counseling recommendations.   Return in about 2 weeks (around 06/07/2022) for ROB.   Growth [place] ordered for GDM   Carie Caddy, CNM  Domingo Pulse, [facility]  05/24/22  9:31 AM

## 2022-05-28 ENCOUNTER — Other Ambulatory Visit: Payer: Self-pay | Admitting: Licensed Practical Nurse

## 2022-05-28 DIAGNOSIS — Z3689 Encounter for other specified antenatal screening: Secondary | ICD-10-CM

## 2022-05-28 DIAGNOSIS — O24410 Gestational diabetes mellitus in pregnancy, diet controlled: Secondary | ICD-10-CM

## 2022-05-28 DIAGNOSIS — O0990 Supervision of high risk pregnancy, unspecified, unspecified trimester: Secondary | ICD-10-CM

## 2022-05-29 ENCOUNTER — Encounter: Payer: 59 | Attending: Family Medicine | Admitting: Dietician

## 2022-05-29 ENCOUNTER — Encounter: Payer: Self-pay | Admitting: Dietician

## 2022-05-29 VITALS — BP 110/78 | Ht 66.0 in | Wt 241.7 lb

## 2022-05-29 DIAGNOSIS — O24410 Gestational diabetes mellitus in pregnancy, diet controlled: Secondary | ICD-10-CM | POA: Insufficient documentation

## 2022-05-29 NOTE — Patient Instructions (Signed)
Continue to control carb intake at meals and snacks, great job so far! Include a snack at bedtime if dinner is 3-4 hours or longer before going to sleep.  Include protein along with a carb source with snacks to minimize effect on blood sugar. Keep up some regular activity, a few minutes at a time, several times a day is fine if exercising for longer periods in uncomfortable and can keep blood sugars in range after eating.

## 2022-05-29 NOTE — Progress Notes (Signed)
Patient's BG record indicates fasting BGs ranging 85-98, and post-meal BGs ranging 102-136, per patient recall. Patient's food diary indicates controlled carb intake with meals and snacks, protein sources with each meal and most snacks. Patient is eating french fries several times a week, has been craving fries but is limiting portions. Patient voices good understanding of basic food groups and meal planning.  Provided basic balanced meal plan, and discussed appropriate food choices. Instructed patient on food safety, including avoidance of Listeriosis, and limiting mercury from fish. Discussed importance of maintaining healthy lifestyle habits to reduce risk of Type 2 DM as well as Gestational DM with any future pregnancies. Advised patient to have BG tested ideally annually, as well as prior to attempting future pregnancies.

## 2022-05-31 ENCOUNTER — Ambulatory Visit
Admission: RE | Admit: 2022-05-31 | Discharge: 2022-05-31 | Disposition: A | Discharge status: Home / Self Care | Payer: 59 | Source: Ambulatory Visit | Attending: Licensed Practical Nurse | Admitting: Licensed Practical Nurse

## 2022-05-31 DIAGNOSIS — Z3689 Encounter for other specified antenatal screening: Secondary | ICD-10-CM | POA: Diagnosis present

## 2022-05-31 DIAGNOSIS — O24410 Gestational diabetes mellitus in pregnancy, diet controlled: Secondary | ICD-10-CM | POA: Diagnosis present

## 2022-05-31 DIAGNOSIS — O0990 Supervision of high risk pregnancy, unspecified, unspecified trimester: Secondary | ICD-10-CM | POA: Diagnosis present

## 2022-06-07 ENCOUNTER — Ambulatory Visit (INDEPENDENT_AMBULATORY_CARE_PROVIDER_SITE_OTHER): Payer: 59 | Admitting: Obstetrics

## 2022-06-07 VITALS — BP 114/80 | Wt 241.0 lb

## 2022-06-07 DIAGNOSIS — O0990 Supervision of high risk pregnancy, unspecified, unspecified trimester: Secondary | ICD-10-CM

## 2022-06-07 DIAGNOSIS — Z3A33 33 weeks gestation of pregnancy: Secondary | ICD-10-CM

## 2022-06-07 DIAGNOSIS — O24419 Gestational diabetes mellitus in pregnancy, unspecified control: Secondary | ICD-10-CM

## 2022-06-07 LAB — POCT URINALYSIS DIPSTICK OB
Glucose, UA: NEGATIVE
POC,PROTEIN,UA: NEGATIVE

## 2022-06-07 NOTE — Progress Notes (Signed)
ROB- no concerns 

## 2022-06-07 NOTE — Progress Notes (Signed)
Routine Prenatal Care Visit  Subjective  Audrey Clarke is a 31 y.o. G1P0000 at [redacted]w[redacted]d being seen today for ongoing prenatal care.  She is currently monitored for the following issues for this high-risk pregnancy and has Supervision of normal first pregnancy, antepartum; Anxiety with depression; Chronic migraine without aura without status migrainosus, not intractable; Obesity affecting pregnancy; Rh negative state in antepartum period; Abnormal glucose tolerance in pregnancy; and GDM (gestational diabetes mellitus) on their problem list.  ----------------------------------------------------------------------------------- Patient reports no complaints.  She has been checking her sugars as advised, and reports that less than 30% are elevated. She is not printing out her readings. Contractions: Not present. Vag. Bleeding: None.  Movement: Present. Leaking Fluid denies.  ----------------------------------------------------------------------------------- The following portions of the patient's history were reviewed and updated as appropriate: allergies, current medications, past family history, past medical history, past social history, past surgical history and problem list. Problem list updated.  Objective  Blood pressure 114/80, weight 241 lb (109.3 kg), last menstrual period 10/14/2021. Pregravid weight 239 lb (108.4 kg) Total Weight Gain 2 lb (0.907 kg) Urinalysis: Urine Protein Negative  Urine Glucose Negative  Fetal Status:     Movement: Present     General:  Alert, oriented and cooperative. Patient is in no acute distress.  Skin: Skin is warm and dry. No rash noted.   Cardiovascular: Normal heart rate noted  Respiratory: Normal respiratory effort, no problems with respiration noted  Abdomen: Soft, gravid, appropriate for gestational age. Pain/Pressure: Present     Pelvic:  Cervical exam deferred        Extremities: Normal range of motion.     Mental Status: Normal mood and affect. Normal  behavior. Normal judgment and thought content.   Assessment   30 y.o. G1P0000 at [redacted]w[redacted]d by  07/21/2022, by Last Menstrual Period presenting for routine prenatal visit  Plan   pregnancy 1 Problems (from 12/12/21 to present)    Problem Noted Resolved   Rh negative state in antepartum period 03/20/2022 by Catalina Antigua, MD No   Obesity affecting pregnancy 12/27/2021 by Tresea Mall, CNM No   Supervision of normal first pregnancy, antepartum 12/12/2021 by Natale Milch, MD No   Overview Addendum 04/24/2022  5:15 PM by Mirna Mires, CNM     Nursing Staff Provider  Office Location  [facility] Dating  [redacted]w[redacted]d on 2/27   Language  English Anatomy US  Incomplete views 4/28, repeated nml  Flu Vaccine   Genetic Screen  NIPS: neg, female   TDaP vaccine   04/22/22 Hgb A1C or  GTT Early : Third trimester :   Covid vaccinated   LAB RESULTS   Rhogam  04/22/22 Blood Type A/Negative/-- (02/15 1541)   Feeding Plan  Antibody Negative (02/15 1541)  Contraception  Rubella 1.59 (02/15 1541)  Circumcision  RPR Non Reactive (02/15 1541)   Pediatrician   HBsAg Negative (02/15 1541)   Support Person Zach  HIV Non Reactive (02/15 1541)  Prenatal Classes  Varicella immune    GBS  (For PCN allergy, check sensitivities)   BTL Consent     VBAC Consent  Pap  2022 NIL    Hgb Electro      CF      SMA                   Preterm labor symptoms and general obstetric precautions including but not limited to vaginal bleeding, contractions, leaking of fluid and fetal movement were reviewed in detail with the patient.  Please refer to After Visit Summary for other counseling recommendations.  Encouraged to bring her glucose readings with her to visits. Also advised her to see an MD . Plan to deliver by [redacted] weeks gestation. Her last growth scan looks great. Have ordered another growth scan for 36 weeks.  Return in about 2 weeks (around 06/21/2022) for return OB needs to see an MD for several visits. Gestational  diabetes.  Mirna Mires, CNM  06/07/2022 8:57 AM

## 2022-06-13 ENCOUNTER — Telehealth: Payer: Self-pay

## 2022-06-13 NOTE — Telephone Encounter (Signed)
TRIAGE VOICEMAIL: Patient is [redacted] wks pregnant. She has had more swelling in ankles and tops of feet since last weekend. Last night she noticed it is turning into pitting edema on tops of feet. She has elevated, used cool foot bath, she uses compression socks and rests when able at work. She denies numbness/pain. Inquiring if she needs to come in for BP Check. [phone]

## 2022-06-13 NOTE — Telephone Encounter (Signed)
Spoke with patient. She denies headache/blurred vision. She is not currently on BP Meds. She has an electric BP machine at home, but doesn't believe it is accurate. Advised she can schedule nurse visit for in office BP Check if desired. She will go to [employer] on lunch and check her BP and get a manual cuff to check at home. She will report back to [place] BP >140/90.

## 2022-06-14 ENCOUNTER — Other Ambulatory Visit: Payer: Self-pay

## 2022-06-14 ENCOUNTER — Telehealth: Payer: Self-pay

## 2022-06-14 ENCOUNTER — Encounter: Payer: Self-pay | Admitting: Obstetrics and Gynecology

## 2022-06-14 ENCOUNTER — Observation Stay
Admission: RE | Admit: 2022-06-14 | Discharge: 2022-06-14 | Disposition: A | Discharge status: Home / Self Care | Payer: 59 | Attending: Obstetrics and Gynecology | Admitting: Obstetrics and Gynecology

## 2022-06-14 DIAGNOSIS — E669 Obesity, unspecified: Secondary | ICD-10-CM

## 2022-06-14 DIAGNOSIS — O09513 Supervision of elderly primigravida, third trimester: Secondary | ICD-10-CM | POA: Insufficient documentation

## 2022-06-14 DIAGNOSIS — O163 Unspecified maternal hypertension, third trimester: Principal | ICD-10-CM | POA: Diagnosis present

## 2022-06-14 DIAGNOSIS — O99213 Obesity complicating pregnancy, third trimester: Secondary | ICD-10-CM

## 2022-06-14 DIAGNOSIS — O24410 Gestational diabetes mellitus in pregnancy, diet controlled: Secondary | ICD-10-CM

## 2022-06-14 DIAGNOSIS — Z7982 Long term (current) use of aspirin: Secondary | ICD-10-CM | POA: Diagnosis not present

## 2022-06-14 DIAGNOSIS — Z79899 Other long term (current) drug therapy: Secondary | ICD-10-CM | POA: Insufficient documentation

## 2022-06-14 DIAGNOSIS — O24419 Gestational diabetes mellitus in pregnancy, unspecified control: Secondary | ICD-10-CM | POA: Diagnosis not present

## 2022-06-14 DIAGNOSIS — Z3400 Encounter for supervision of normal first pregnancy, unspecified trimester: Secondary | ICD-10-CM

## 2022-06-14 DIAGNOSIS — Z3A34 34 weeks gestation of pregnancy: Secondary | ICD-10-CM | POA: Insufficient documentation

## 2022-06-14 DIAGNOSIS — Z6791 Unspecified blood type, Rh negative: Secondary | ICD-10-CM

## 2022-06-14 LAB — CBC
HCT: 28.5 % — ABNORMAL LOW (ref 36.0–46.0)
Hemoglobin: 9.6 g/dL — ABNORMAL LOW (ref 12.0–15.0)
MCH: 32.4 pg (ref 26.0–34.0)
MCHC: 33.7 g/dL (ref 30.0–36.0)
MCV: 96.3 fL (ref 80.0–100.0)
Platelets: 153 10*3/uL (ref 150–400)
RBC: 2.96 MIL/uL — ABNORMAL LOW (ref 3.87–5.11)
RDW: 15.8 % — ABNORMAL HIGH (ref 11.5–15.5)
WBC: 9.6 10*3/uL (ref 4.0–10.5)
nRBC: 0 % (ref 0.0–0.2)

## 2022-06-14 LAB — COMPREHENSIVE METABOLIC PANEL
ALT: 17 U/L (ref 0–44)
AST: 19 U/L (ref 15–41)
Albumin: 2.8 g/dL — ABNORMAL LOW (ref 3.5–5.0)
Alkaline Phosphatase: 100 U/L (ref 38–126)
Anion gap: 7 (ref 5–15)
BUN: 10 mg/dL (ref 6–20)
CO2: 20 mmol/L — ABNORMAL LOW (ref 22–32)
Calcium: 8.8 mg/dL — ABNORMAL LOW (ref 8.9–10.3)
Chloride: 110 mmol/L (ref 98–111)
Creatinine, Ser: 0.68 mg/dL (ref 0.44–1.00)
GFR, Estimated: >60 mL/min (ref >60)
Glucose, Bld: 113 mg/dL — ABNORMAL HIGH (ref 70–99)
Potassium: 3.3 mmol/L — ABNORMAL LOW (ref 3.5–5.1)
Sodium: 137 mmol/L (ref 135–145)
Total Bilirubin: 0.8 mg/dL (ref 0.3–1.2)
Total Protein: 5.4 g/dL — ABNORMAL LOW (ref 6.5–8.1)

## 2022-06-14 LAB — PROTEIN / CREATININE RATIO, URINE
Creatinine, Urine: 164 mg/dL
Protein Creatinine Ratio: 0.18 mg/mg{Cre} — ABNORMAL HIGH (ref 0.00–0.15)
Total Protein, Urine: 29 mg/dL

## 2022-06-14 LAB — CHLAMYDIA/NGC RT PCR (ARMC ONLY)
Chlamydia Tr: NOT DETECTED
N gonorrhoeae: NOT DETECTED

## 2022-06-14 MED ORDER — ACETAMINOPHEN 325 MG PO TABS: 650.0000 mg | ORAL_TABLET | ORAL | Status: DC | PRN

## 2022-06-14 NOTE — Progress Notes (Unsigned)
Subjective:    Audrey Clarke is a 31 y.o. female who presents for BP check and fetal monitoring per order from Paula Compton, CNM.   Patient reports compliance with prescribed BP medications: {YES/NO/WILD CARDS:18581}  Last dose of BP medication:   BP Readings from Last 3 Encounters:  06/14/22 130/86  06/07/22 114/80  05/29/22 110/78   Pulse Readings from Last 3 Encounters:  06/14/22 91  01/03/22 86  07/20/19 68   Fetal Tracing:  Baseline: Variability: Accelerations:  Decelerations:  Toco:  Results reviewed with *** and discussed with patient.    Per Dr. ***: [insert plan here]  Patient verbalized understanding of instructions.   Rocco Serene, LPN

## 2022-06-14 NOTE — OB Triage Note (Signed)
Pt reports having elevated blood pressures yesterday. Denies current visual disturbances or epigastric pain. Elaina Hoops

## 2022-06-14 NOTE — Final Progress Note (Signed)
Final Progress Note  Patient ID: Audrey Clarke MRN: [id number] DOB/AGE: [birth year] 31 y.o.  Admit date: 06/14/2022 Admitting provider: Imagene Riches, CNM Discharge date: 06/14/2022   Admission Diagnoses: elevated blood pressure in third trimester of pregnancy  Discharge Diagnoses:  Principal Problem:   Elevated blood pressure affecting pregnancy in third trimester, antepartum  Labile blood pressures in third trimester.  History of Present Illness: The patient is a 31 y.o. female G1P0000 at 22w5dwho presents for evaluation of blood pressure. [facility] reporting that she had checked her blood pressures at home and had several elevated readings. She used both a manual and digital set. She was invited to come to the unit for monitoring, blood pressure checks, and lab work. Her pregnancy has been marked by : obesity, gestational diabetes (diet controlled)  Her FBS this morning was 92. Her baby is moving well. She reports feeling BH ucs that are mild, denies any LOF or vaginal bleeding. She was working today, but left her job to come to [employer] Past Medical History:  Diagnosis Date   Anemia    BRCA negative 07/2019   MyRisk neg   Depression    Family history of breast cancer    Gestational diabetes    History of PCOS    IBS (irritable bowel syndrome)    Increased risk of breast cancer 07/2019   IBIS=27.7%/riskscore=22.9%   PCOS (polycystic ovarian syndrome)    Vitamin B12 deficiency    Vitamin D deficiency     Past Surgical History:  Procedure Laterality Date   TONSILLECTOMY AND ADENOIDECTOMY     WISDOM TOOTH EXTRACTION      No current facility-administered medications on file prior to encounter.   Current Outpatient Medications on File Prior to Encounter  Medication Sig Dispense Refill   Accu-Chek Softclix Lancets lancets Use as instructed 100 each 12   aspirin EC 81 MG tablet Take 81 mg by mouth daily.      Blood Glucose Monitoring Suppl (ACCU-CHEK AVIVA PLUS) w/Device KIT Check blood sugar 4 times daily; fasting and 2 hours after each meal 1 kit 0   cetirizine (ZYRTEC) 10 MG tablet Take 10 mg by mouth daily.     Continuous Blood Gluc Sensor (FREESTYLE LIBRE 3 SENSOR) MISC Place 1 sensor on the skin every 14 days. Use to check glucose continuously 2 each 12   famotidine (PEPCID) 20 MG tablet Take 20 mg by mouth 2 (two) times daily.     glucose blood (ACCU-CHEK AVIVA PLUS) test strip Use as instructed 100 each 12   Prenatal Vit-Fe Fumarate-FA (MULTIVITAMIN-PRENATAL) 27-0.8 MG TABS tablet Take 1 tablet by mouth daily at 12 noon.     venlafaxine (EFFEXOR) 75 MG tablet Take 75 mg by mouth daily.      Allergies  Allergen Reactions   Sertraline Other (See Comments)    Dizziness, sweats, panic   Augmentin [Amoxicillin-Pot Clavulanate] Diarrhea and Nausea And Vomiting    Social History   Socioeconomic History   Marital status: Married    Spouse name: Audrey Clarke  Number of children: Not on file   Years of education: Not on file   Highest education level: Not on file  Occupational History   Occupation: [employer] manager   Comment: [employer] Tobacco Use   Smoking status: Never   Smokeless tobacco: Never  Vaping Use   Vaping Use: Never used  Substance and Sexual Activity   Alcohol  use: Never   Drug use: Never   Sexual activity: Yes  Other Topics Concern   Not on file  Social History Narrative   Not on file   Social Determinants of Health   Financial Resource Strain: Not on file  Food Insecurity: Not on file  Transportation Needs: Not on file  Physical Activity: Not on file  Stress: Not on file  Social Connections: Not on file  Intimate Partner Violence: Not on file    Family History  Problem Relation Age of Onset   Asthma Mother    Hypertension Mother    Osteoarthritis Mother    Breast cancer Mother 41   Hypertension Father    Breast cancer Maternal Grandmother 59    Diabetes Maternal Grandfather    Prostate cancer Maternal Grandfather    Diabetes Paternal Grandfather    Hypertension Paternal Grandfather    Alzheimer's disease Paternal Grandfather    Hypertension Maternal Aunt    Diabetes Maternal Aunt    Breast cancer Maternal Aunt 45     Review of Systems  Constitutional: Negative.   HENT: Negative.    Eyes: Negative.   Respiratory: Negative.    Cardiovascular:  Positive for leg swelling.  Genitourinary: Negative.   Musculoskeletal: Negative.   Skin: Negative.   Neurological: Negative.   Endo/Heme/Allergies: Negative.      Physical Exam: BP 130/86 (BP Location: Right Arm)   Pulse 91   Ht 5' 6"  (1.676 m)   Wt 109.3 kg   LMP 10/14/2021   BMI 38.90 kg/m   Physical Exam Constitutional:      Appearance: Normal appearance. She is obese.  Genitourinary:     Genitourinary Comments: Exam deferred.  HENT:     Head: Normocephalic and atraumatic.  Cardiovascular:     Rate and Rhythm: Normal rate and regular rhythm.  Pulmonary:     Effort: Pulmonary effort is normal.     Breath sounds: Normal breath sounds.  Abdominal:     Comments: Gravid uterus  Musculoskeletal:     Cervical back: Normal range of motion and neck supple.  Neurological:     General: No focal deficit present.     Mental Status: She is alert and oriented to person, place, and time.  Skin:    General: Skin is warm and dry.  Psychiatric:     Comments: Appears mildly anxious during the interview.  Vitals and nursing note reviewed.     Consults: None  Significant Findings/ Diagnostic Studies: labs:  Results for orders placed or performed during the hospital encounter of 06/14/22 (from the past 24 hour(s))  CBC     Status: Abnormal   Collection Time: 06/14/22 11:03 AM  Result Value Ref Range   WBC 9.6 4.0 - 10.5 K/uL   RBC 2.96 (L) 3.87 - 5.11 MIL/uL   Hemoglobin 9.6 (L) 12.0 - 15.0 g/dL   HCT 28.5 (L) 36.0 - 46.0 %   MCV 96.3 80.0 - 100.0 fL   MCH 32.4 26.0 -  34.0 pg   MCHC 33.7 30.0 - 36.0 g/dL   RDW 15.8 (H) 11.5 - 15.5 %   Platelets 153 150 - 400 K/uL   nRBC 0.0 0.0 - 0.2 %  Comprehensive metabolic panel     Status: Abnormal   Collection Time: 06/14/22 11:03 AM  Result Value Ref Range   Sodium 137 135 - 145 mmol/L   Potassium 3.3 (L) 3.5 - 5.1 mmol/L   Chloride 110 98 - 111 mmol/L  CO2 20 (L) 22 - 32 mmol/L   Glucose, Bld 113 (H) 70 - 99 mg/dL   BUN 10 6 - 20 mg/dL   Creatinine, Ser 0.68 0.44 - 1.00 mg/dL   Calcium 8.8 (L) 8.9 - 10.3 mg/dL   Total Protein 5.4 (L) 6.5 - 8.1 g/dL   Albumin 2.8 (L) 3.5 - 5.0 g/dL   AST 19 15 - 41 U/L   ALT 17 0 - 44 U/L   Alkaline Phosphatase 100 38 - 126 U/L   Total Bilirubin 0.8 0.3 - 1.2 mg/dL   GFR, Estimated >60 >60 mL/min   Anion gap 7 5 - 15  Protein / creatinine ratio, urine     Status: Abnormal   Collection Time: 06/14/22 11:08 AM  Result Value Ref Range   Creatinine, Urine 164 mg/dL   Total Protein, Urine 29 mg/dL   Protein Creatinine Ratio 0.18 (H) 0.00 - 0.15 mg/mg[Cre]  Chlamydia/NGC rt PCR ([facility] only)     Status: None   Collection Time: 06/14/22 12:32 PM   Specimen: Cervical/Vaginal swab; Genital  Result Value Ref Range   Specimen source GC/Chlam ENDOCERVICAL    Chlamydia Tr NOT DETECTED NOT DETECTED   N gonorrhoeae NOT DETECTED NOT DETECTED     Procedures: EFM NST Baseline FHR: 135 beats/min Variability: moderate Accelerations: present Decelerations: absent Tocometry: mild BH Ucs noted. She is aware of these but does consider them at all painful.  Interpretation:  INDICATIONS: patient reassurance, rule out uterine contractions, and for Pre eclampsia work up. RESULTS:  A NST procedure was performed with FHR monitoring and a normal baseline established, appropriate time of 20-40 minutes of evaluation, and accels >2 seen w 15x15 characteristics.  Results show a REACTIVE NST.    Hospital Course: The patient was admitted to Labor and Delivery Triage for observation. She  provided a urine sample. Pre eclampsia labs were drawn and sent. Her NST was wonderfully reactive, and she was provided ample Po fluids to address her occasional contractions. Culture swabs for GC/CT and GBS were retrieved and also sent to the lab.  Serial blood press. Her labs  indicated some anemia and a borderline potassium level, but the PIH component was WNL. With reassuring VS, a Category 1 tracing, she was reassured and then discharged home with a plan to be seen in the office in a week for ROB, NST, and blood pressure check. She was carefully advised to contact the [facility] office for any development of headache, visual changes or HTN.  Discharge Condition: good  Disposition: Discharge disposition: 01-Home or Self Care       Diet: Diabetic diet  Discharge Activity: Home to rest today  Discharge Instructions     Fetal Kick Count:  Lie on our left side for one hour after a meal, and count the number of times your baby kicks.  If it is less than 5 times, get up, move around and drink some juice.  Repeat the test 30 minutes later.  If it is still less than 5 kicks in an hour, notify your doctor.   Complete by: As directed    Fetal Kick Count:  Lie on our left side for one hour after a meal, and count the number of times your baby kicks.  If it is less than 5 times, get up, move around and drink some juice.  Repeat the test 30 minutes later.  If it is still less than 5 kicks in an hour, notify your doctor.   Complete  by: As directed    LABOR:  When conractions begin, you should start to time them from the beginning of one contraction to the beginning  of the next.  When contractions are 5 - 10 minutes apart or less and have been regular for at least an hour, you should call your health care provider.   Complete by: As directed    LABOR:  When conractions begin, you should start to time them from the beginning of one contraction to the beginning  of the next.  When contractions are 5 - 10  minutes apart or less and have been regular for at least an hour, you should call your health care provider.   Complete by: As directed    Notify physician for bleeding from the vagina   Complete by: As directed    Notify physician for bleeding from the vagina   Complete by: As directed    Notify physician for blurring of vision or spots before the eyes   Complete by: As directed    Notify physician for blurring of vision or spots before the eyes   Complete by: As directed    Notify physician for chills or fever   Complete by: As directed    Notify physician for chills or fever   Complete by: As directed    Notify physician for fainting spells, "black outs" or loss of consciousness   Complete by: As directed    Notify physician for fainting spells, "black outs" or loss of consciousness   Complete by: As directed    Notify physician for increase in vaginal discharge   Complete by: As directed    Notify physician for increase in vaginal discharge   Complete by: As directed    Notify physician for leaking of fluid   Complete by: As directed    Notify physician for leaking of fluid   Complete by: As directed    Notify physician for pain or burning when urinating   Complete by: As directed    Notify physician for pain or burning when urinating   Complete by: As directed    Notify physician for pelvic pressure (sudden increase)   Complete by: As directed    Notify physician for pelvic pressure (sudden increase)   Complete by: As directed    Notify physician for severe or continued nausea or vomiting   Complete by: As directed    Notify physician for severe or continued nausea or vomiting   Complete by: As directed    Notify physician for sudden gushing of fluid from the vagina (with or without continued leaking)   Complete by: As directed    Notify physician for sudden gushing of fluid from the vagina (with or without continued leaking)   Complete by: As directed    Notify  physician for sudden, constant, or occasional abdominal pain   Complete by: As directed    Notify physician for sudden, constant, or occasional abdominal pain   Complete by: As directed    Notify physician if baby moving less than usual   Complete by: As directed    Notify physician if baby moving less than usual   Complete by: As directed       Allergies as of 06/14/2022       Reactions   Sertraline Other (See Comments)   Dizziness, sweats, panic   Augmentin [amoxicillin-pot Clavulanate] Diarrhea, Nausea And Vomiting        Medication List     TAKE  these medications    Accu-Chek Aviva Plus test strip Generic drug: glucose blood Use as instructed   Accu-Chek Aviva Plus w/Device Kit Check blood sugar 4 times daily; fasting and 2 hours after each meal   Accu-Chek Softclix Lancets lancets Use as instructed   aspirin EC 81 MG tablet Take 81 mg by mouth daily.   cetirizine 10 MG tablet Commonly known as: ZYRTEC Take 10 mg by mouth daily.   famotidine 20 MG tablet Commonly known as: PEPCID Take 20 mg by mouth 2 (two) times daily.   FreeStyle Libre 3 Sensor Misc Place 1 sensor on the skin every 14 days. Use to check glucose continuously   multivitamin-prenatal 27-0.8 MG Tabs tablet Take 1 tablet by mouth daily at 12 noon.   venlafaxine 75 MG tablet Commonly known as: EFFEXOR Take 75 mg by mouth daily.        [facility]. Schedule an appointment as soon as possible for a visit in 1 week(s).   Specialty: Obstetrics and Gynecology Why: blood pressure recheck and NST Contact information: [address] [place] [address] [phone]                Total time spent taking care of this patient: 45 minutesproviding direct care, chart review, ordering of labs and documenttation.  Signed: Imagene Riches, CNM  06/14/2022, 5:34 PM

## 2022-06-14 NOTE — OB Triage Note (Signed)
Discharge home. Left floor ambulatory. Dineen Kid

## 2022-06-14 NOTE — Telephone Encounter (Signed)
Pt called triage line last night reporting bp 160/88 then 135/95 with swelling in feet. Called pt back to see if she went to L&D and to see how BP was this morning. No answer left message to return call.

## 2022-06-14 NOTE — Discharge Summary (Signed)
Please see Final progress Note.  Audrey Clarke, CNM  06/14/2022 12:41 PM

## 2022-06-16 LAB — CULTURE, BETA STREP (GROUP B ONLY)

## 2022-06-17 ENCOUNTER — Ambulatory Visit
Admission: RE | Admit: 2022-06-17 | Discharge: 2022-06-17 | Disposition: A | Discharge status: Home / Self Care | Payer: 59 | Source: Ambulatory Visit | Attending: Obstetrics & Gynecology | Admitting: Obstetrics & Gynecology

## 2022-06-17 ENCOUNTER — Telehealth: Payer: Self-pay

## 2022-06-17 ENCOUNTER — Encounter: Payer: Self-pay | Admitting: Obstetrics & Gynecology

## 2022-06-17 ENCOUNTER — Other Ambulatory Visit: Payer: 59

## 2022-06-17 ENCOUNTER — Ambulatory Visit (INDEPENDENT_AMBULATORY_CARE_PROVIDER_SITE_OTHER): Payer: 59 | Admitting: Obstetrics & Gynecology

## 2022-06-17 VITALS — BP 122/80 | Wt 249.0 lb

## 2022-06-17 DIAGNOSIS — O24419 Gestational diabetes mellitus in pregnancy, unspecified control: Secondary | ICD-10-CM

## 2022-06-17 DIAGNOSIS — O24410 Gestational diabetes mellitus in pregnancy, diet controlled: Secondary | ICD-10-CM | POA: Diagnosis present

## 2022-06-17 DIAGNOSIS — O288 Other abnormal findings on antenatal screening of mother: Secondary | ICD-10-CM

## 2022-06-17 DIAGNOSIS — O99213 Obesity complicating pregnancy, third trimester: Secondary | ICD-10-CM | POA: Insufficient documentation

## 2022-06-17 DIAGNOSIS — Z3400 Encounter for supervision of normal first pregnancy, unspecified trimester: Secondary | ICD-10-CM

## 2022-06-17 DIAGNOSIS — O99820 Streptococcus B carrier state complicating pregnancy: Secondary | ICD-10-CM

## 2022-06-17 DIAGNOSIS — O163 Unspecified maternal hypertension, third trimester: Secondary | ICD-10-CM

## 2022-06-17 DIAGNOSIS — D696 Thrombocytopenia, unspecified: Secondary | ICD-10-CM

## 2022-06-17 LAB — POCT URINALYSIS DIPSTICK OB: Glucose, UA: NEGATIVE

## 2022-06-17 NOTE — Telephone Encounter (Signed)
Patient contacted office stating that she saw Dr. Marice Potter this morning and wanting to know if she should be out of work due to ultrasound or if a letter needs to be written for employer  restricting work schedule or duties? Patient would like a call back when available. KW

## 2022-06-17 NOTE — Progress Notes (Signed)
   PRENATAL VISIT NOTE  Subjective:  Audrey Clarke is a 31 y.o. G1P0000 at [redacted]w[redacted]d being seen today for ongoing prenatal care.  She is currently monitored for the following issues for this high-risk pregnancy and has Supervision of normal first pregnancy, antepartum; Anxiety with depression; Chronic migraine without aura without status migrainosus, not intractable; Obesity affecting pregnancy; Rh negative state in antepartum period; Abnormal glucose tolerance in pregnancy; GDM (gestational diabetes mellitus); and Elevated blood pressure affecting pregnancy in third trimester, antepartum on their problem list.  Patient reports  that she sees "spots" several times per day for the last 2 weeks. She denies headaches, RUQ or epigastric pain .  Contractions: Irregular. Vag. Bleeding: None.  Movement: Present. Denies leaking of fluid.   She was noted to have elevated BP last week and was sent to L&D for evaluation. Her labs were essentially normal, but her platelets were 153K. In February of this year they were 223K.  The following portions of the patient's history were reviewed and updated as appropriate: allergies, current medications, past family history, past medical history, past social history, past surgical history and problem list.   Objective:   Vitals:   06/17/22 0843  BP: 122/80  Weight: 241 lb (109.3 kg)    Fetal Status:     Movement: Present     General:  Alert, oriented and cooperative. Patient is in no acute distress.  Skin: Skin is warm and dry. No rash noted.   Cardiovascular: Normal heart rate noted  Respiratory: Normal respiratory effort, no problems with respiration noted  Abdomen: Soft, gravid, appropriate for gestational age.  Pain/Pressure: Present     Pelvic:  deferred     Extremities: Normal range of motion.     Mental Status: Normal mood and affect. Normal behavior. Normal judgment and thought content.  DTRs- 3+  3+ pitting pedal edema NST- reassuring but non-  reactive  Assessment and Plan:  Pregnancy: G1P0000 at [redacted]w[redacted]d 1. Supervision of normal first pregnancy, antepartum  - POC Urinalysis Dipstick OB  2. Platelets decreased (HCC)  - CBC  3. Non-reactive NST today- she will go for BPP today  4. + GBS- will plan to treat in labor  Preterm labor symptoms and general obstetric precautions including but not limited to vaginal bleeding, contractions, leaking of fluid and fetal movement were reviewed in detail with the patient. Please refer to After Visit Summary for other counseling recommendations.   No follow-ups on file.  Future Appointments  Date Time Provider Department Center  06/24/2022  9:35 AM Linzie Collin, MD [facility] None  06/27/2022  8:00 AM [facility] [place] 2 [facility] None  07/02/2022  8:35 AM Linzie Collin, MD [facility] None  07/08/2022  8:55 AM Dominic, Courtney Heys, CNM [facility] None  07/15/2022  8:55 AM Allie Bossier, MD [facility] None  07/23/2022  8:55 AM Dominic, Courtney Heys, CNM [facility] None    Allie Bossier, MD

## 2022-06-18 ENCOUNTER — Encounter: Payer: 59 | Admitting: Advanced Practice Midwife

## 2022-06-18 ENCOUNTER — Other Ambulatory Visit: Payer: Self-pay | Admitting: Licensed Practical Nurse

## 2022-06-18 ENCOUNTER — Encounter: Payer: Self-pay | Admitting: Obstetrics and Gynecology

## 2022-06-18 ENCOUNTER — Other Ambulatory Visit: Payer: Self-pay

## 2022-06-18 ENCOUNTER — Observation Stay
Admission: EM | Admit: 2022-06-18 | Discharge: 2022-06-18 | Disposition: A | Discharge status: Home / Self Care | Payer: 59 | Attending: Obstetrics and Gynecology | Admitting: Obstetrics and Gynecology

## 2022-06-18 DIAGNOSIS — R609 Edema, unspecified: Secondary | ICD-10-CM | POA: Diagnosis not present

## 2022-06-18 DIAGNOSIS — O133 Gestational [pregnancy-induced] hypertension without significant proteinuria, third trimester: Secondary | ICD-10-CM | POA: Insufficient documentation

## 2022-06-18 DIAGNOSIS — O1203 Gestational edema, third trimester: Secondary | ICD-10-CM | POA: Diagnosis not present

## 2022-06-18 DIAGNOSIS — Z7982 Long term (current) use of aspirin: Secondary | ICD-10-CM | POA: Insufficient documentation

## 2022-06-18 DIAGNOSIS — R519 Headache, unspecified: Secondary | ICD-10-CM | POA: Diagnosis not present

## 2022-06-18 DIAGNOSIS — Z79899 Other long term (current) drug therapy: Secondary | ICD-10-CM | POA: Insufficient documentation

## 2022-06-18 DIAGNOSIS — Z3A35 35 weeks gestation of pregnancy: Secondary | ICD-10-CM | POA: Insufficient documentation

## 2022-06-18 DIAGNOSIS — O99891 Other specified diseases and conditions complicating pregnancy: Secondary | ICD-10-CM | POA: Diagnosis not present

## 2022-06-18 DIAGNOSIS — O322XX0 Maternal care for transverse and oblique lie, not applicable or unspecified: Secondary | ICD-10-CM

## 2022-06-18 DIAGNOSIS — O139 Gestational [pregnancy-induced] hypertension without significant proteinuria, unspecified trimester: Secondary | ICD-10-CM

## 2022-06-18 DIAGNOSIS — Z3400 Encounter for supervision of normal first pregnancy, unspecified trimester: Secondary | ICD-10-CM

## 2022-06-18 DIAGNOSIS — O0990 Supervision of high risk pregnancy, unspecified, unspecified trimester: Secondary | ICD-10-CM

## 2022-06-18 DIAGNOSIS — O26899 Other specified pregnancy related conditions, unspecified trimester: Secondary | ICD-10-CM | POA: Diagnosis present

## 2022-06-18 DIAGNOSIS — O99213 Obesity complicating pregnancy, third trimester: Secondary | ICD-10-CM

## 2022-06-18 DIAGNOSIS — O24419 Gestational diabetes mellitus in pregnancy, unspecified control: Secondary | ICD-10-CM | POA: Diagnosis not present

## 2022-06-18 DIAGNOSIS — Z6791 Unspecified blood type, Rh negative: Secondary | ICD-10-CM

## 2022-06-18 DIAGNOSIS — O26893 Other specified pregnancy related conditions, third trimester: Secondary | ICD-10-CM

## 2022-06-18 LAB — CBC WITH DIFFERENTIAL/PLATELET
Abs Immature Granulocytes: 0.1 10*3/uL — ABNORMAL HIGH (ref 0.00–0.07)
Basophils Absolute: 0 10*3/uL (ref 0.0–0.1)
Basophils Relative: 0 %
Eosinophils Absolute: 0 10*3/uL (ref 0.0–0.5)
Eosinophils Relative: 0 %
HCT: 28.2 % — ABNORMAL LOW (ref 36.0–46.0)
Hemoglobin: 9.6 g/dL — ABNORMAL LOW (ref 12.0–15.0)
Immature Granulocytes: 1 %
Lymphocytes Relative: 20 %
Lymphs Abs: 1.6 10*3/uL (ref 0.7–4.0)
MCH: 32.5 pg (ref 26.0–34.0)
MCHC: 34 g/dL (ref 30.0–36.0)
MCV: 95.6 fL (ref 80.0–100.0)
Monocytes Absolute: 0.5 10*3/uL (ref 0.1–1.0)
Monocytes Relative: 7 %
Neutro Abs: 5.7 10*3/uL (ref 1.7–7.7)
Neutrophils Relative %: 72 %
Platelets: 155 10*3/uL (ref 150–400)
RBC: 2.95 MIL/uL — ABNORMAL LOW (ref 3.87–5.11)
RDW: 15.8 % — ABNORMAL HIGH (ref 11.5–15.5)
WBC: 8 10*3/uL (ref 4.0–10.5)
nRBC: 0 % (ref 0.0–0.2)

## 2022-06-18 LAB — PROTEIN / CREATININE RATIO, URINE
Creatinine, Urine: 61 mg/dL
Creatinine, Urine: 222 mg/dL
Protein Creatinine Ratio: 0.14 mg/mg{Cre} (ref 0.00–0.15)
Protein, Ur: 13.5 mg/dL
Protein/Creat Ratio: 221 mg/g creat — ABNORMAL HIGH (ref 0–200)
Total Protein, Urine: 31 mg/dL

## 2022-06-18 LAB — CBC
Hematocrit: 31.2 % — ABNORMAL LOW (ref 34.0–46.6)
Hemoglobin: 10.4 g/dL — ABNORMAL LOW (ref 11.1–15.9)
MCH: 33.1 pg — ABNORMAL HIGH (ref 26.6–33.0)
MCHC: 33.3 g/dL (ref 31.5–35.7)
MCV: 99 fL — ABNORMAL HIGH (ref 79–97)
Platelets: 160 10*3/uL (ref 150–450)
RBC: 3.14 x10E6/uL — ABNORMAL LOW (ref 3.77–5.28)
RDW: 14.8 % (ref 11.7–15.4)
WBC: 8.9 10*3/uL (ref 3.4–10.8)

## 2022-06-18 LAB — COMPREHENSIVE METABOLIC PANEL
ALT: 17 U/L (ref 0–44)
AST: 19 U/L (ref 15–41)
Albumin: 2.7 g/dL — ABNORMAL LOW (ref 3.5–5.0)
Alkaline Phosphatase: 111 U/L (ref 38–126)
Anion gap: 5 (ref 5–15)
BUN: 7 mg/dL (ref 6–20)
CO2: 21 mmol/L — ABNORMAL LOW (ref 22–32)
Calcium: 8.6 mg/dL — ABNORMAL LOW (ref 8.9–10.3)
Chloride: 112 mmol/L — ABNORMAL HIGH (ref 98–111)
Creatinine, Ser: 0.74 mg/dL (ref 0.44–1.00)
GFR, Estimated: >60 mL/min (ref >60)
Glucose, Bld: 105 mg/dL — ABNORMAL HIGH (ref 70–99)
Potassium: 3.6 mmol/L (ref 3.5–5.1)
Sodium: 138 mmol/L (ref 135–145)
Total Bilirubin: 0.7 mg/dL (ref 0.3–1.2)
Total Protein: 5.5 g/dL — ABNORMAL LOW (ref 6.5–8.1)

## 2022-06-18 MED ORDER — ONDANSETRON 4 MG PO TBDP: 4.0000 mg | ORAL_TABLET | Freq: Four times a day (QID) | ORAL | Status: DC | PRN

## 2022-06-18 MED ORDER — BUTALBITAL-APAP-CAFFEINE 50-325-40 MG PO TABS
2.0000 | ORAL_TABLET | Freq: Once | ORAL | Status: AC
  Administered 2022-06-18: 2 via ORAL
  Filled 2022-06-18: qty 2

## 2022-06-18 MED ORDER — MAGNESIUM OXIDE -MG SUPPLEMENT 400 (240 MG) MG PO TABS
400.0000 mg | ORAL_TABLET | Freq: Once | ORAL | Status: AC
Start: 2022-06-18 — End: 2022-06-18
  Administered 2022-06-18: 400 mg via ORAL
  Filled 2022-06-18: qty 1

## 2022-06-18 NOTE — Telephone Encounter (Signed)
Done

## 2022-06-18 NOTE — OB Triage Note (Signed)
Discharge instructions, labor precautions, and follow-up care reviewed with patient and significant other. All questions answered. Patient verbalized understanding. Discharged ambulatory off unit.   

## 2022-06-18 NOTE — OB Triage Note (Signed)
Patient is a G1P0 at [redacted]w[redacted]d who presents to unit c/o headache since 0300 that has not been relieved with Tylenol and difficulty focusing. Patient reports being monitored for PreE in clinic for the past week but labs have been normal. Reports +FM, denies vaginal bleeding and LOF. External monitors applied and assessing.

## 2022-06-18 NOTE — Discharge Summary (Signed)
Physician Final Progress Note  Patient ID: Audrey Clarke MRN: [id number] DOB/AGE: December 11, 1990 31 y.o.  Admit date: 06/18/2022 Admitting provider: Harlin Heys, MD Discharge date: 06/18/2022   Admission Diagnoses:  1) intrauterine pregnancy at [redacted]w[redacted]d 2) Headache 3) Swelling 4) Visual changes   Discharge Diagnoses:  Principal Problem:   Headache in pregnancy Active Problems:   Gestational hypertension   Transverse lie of fetus    History of Present Illness: The patient is a 31y.o. female G1P0000 at 379w2dho presents for headaches, visual disturbances and swelling.  Around 3am pt woke up not feeling well.  She had some nausea and a throbbing headache. The headache is in the front, "behind the eyes" and sometimes at the back of her neck, it is "throbbing".  She is sensitive to light. She took 2 extra strength Tylenol at 1100, which did not help.  She has tried ice, which has not helped. Throughout the day the headache goes from a "3/10" to a "2/10", it is currently at a "2/10".  She has seen floaters and had blurry vision/feeling dizzy, she does not associate it changes in position. Currently she has a little nausea, which is more like increased salvation.  She was able to eat [employer] today.  [name] was seen for her ROB yesterday, during that visit the provider expressed concern for the amount of swelling she was experiencing was told it was +3 pitting edema, it was also suggested to her that she "brewing" preeclampsia.  During this visit, She had a non reactive NST at that office so was sent for a BPP where she scored an 8/8.  She endorses+FM, denies contractions or LOF/VB   Past Medical History:  Diagnosis Date   Anemia    BRCA negative 07/2019   MyRisk neg   Depression    Family history of breast cancer    Gestational diabetes    History of PCOS    IBS (irritable bowel syndrome)    Increased risk of breast cancer 07/2019   IBIS=27.7%/riskscore=22.9%   PCOS (polycystic  ovarian syndrome)    Vitamin B12 deficiency    Vitamin D deficiency     Past Surgical History:  Procedure Laterality Date   TONSILLECTOMY AND ADENOIDECTOMY     WISDOM TOOTH EXTRACTION      No current facility-administered medications on file prior to encounter.   Current Outpatient Medications on File Prior to Encounter  Medication Sig Dispense Refill   aspirin EC 81 MG tablet Take 81 mg by mouth daily.     cetirizine (ZYRTEC) 10 MG tablet Take 10 mg by mouth daily.     famotidine (PEPCID) 20 MG tablet Take 20 mg by mouth 2 (two) times daily.     Prenatal Vit-Fe Fumarate-FA (MULTIVITAMIN-PRENATAL) 27-0.8 MG TABS tablet Take 1 tablet by mouth daily at 12 noon.     venlafaxine (EFFEXOR) 75 MG tablet Take 75 mg by mouth daily.     Accu-Chek Softclix Lancets lancets Use as instructed 100 each 12   Blood Glucose Monitoring Suppl (ACCU-CHEK AVIVA PLUS) w/Device KIT Check blood sugar 4 times daily; fasting and 2 hours after each meal 1 kit 0   Continuous Blood Gluc Sensor (FREESTYLE LIBRE 3 SENSOR) MISC Place 1 sensor on the skin every 14 days. Use to check glucose continuously 2 each 12   glucose blood (ACCU-CHEK AVIVA PLUS) test strip Use as instructed 100 each 12    Allergies  Allergen Reactions   Sertraline Other (See Comments)  Dizziness, sweats, panic   Augmentin [Amoxicillin-Pot Clavulanate] Diarrhea and Nausea And Vomiting    Social History   Socioeconomic History   Marital status: Married    Spouse name: Hava Massingale   Number of children: Not on file   Years of education: Not on file   Highest education level: Not on file  Occupational History   Occupation: Warehouse manager    Comment: [employer]  Tobacco Use   Smoking status: Never   Smokeless tobacco: Never  Vaping Use   Vaping Use: Never used  Substance and Sexual Activity   Alcohol use: Never   Drug use: Never   Sexual activity: Yes    Birth control/protection: Pill  Other Topics Concern   Not on file   Social History Narrative   Not on file   Social Determinants of Health   Financial Resource Strain: Not on file  Food Insecurity: Not on file  Transportation Needs: Not on file  Physical Activity: Not on file  Stress: Not on file  Social Connections: Not on file  Intimate Partner Violence: Not on file    Family History  Problem Relation Age of Onset   Asthma Mother    Hypertension Mother    Osteoarthritis Mother    Breast cancer Mother 19   Hypertension Father    Breast cancer Maternal Grandmother 60   Diabetes Maternal Grandfather    Prostate cancer Maternal Grandfather    Diabetes Paternal Grandfather    Hypertension Paternal Grandfather    Alzheimer's disease Paternal Grandfather    Hypertension Maternal Aunt    Diabetes Maternal Aunt    Breast cancer Maternal Aunt 45     Review of Systems  Constitutional:  Positive for malaise/fatigue.  Eyes:  Positive for blurred vision and photophobia.  Respiratory: Negative.    Cardiovascular: Negative.   Gastrointestinal:  Positive for nausea. Negative for abdominal pain.  Genitourinary: Negative.   Musculoskeletal: Negative.   Neurological:  Positive for dizziness and headaches.  Endo/Heme/Allergies: Negative.   Psychiatric/Behavioral: Negative.       Physical Exam: BP 127/82   Pulse 89   Temp 98.3 F (36.8 C) (Oral)   Resp 16   Ht 5' 6"  (1.676 m)   Wt 112.9 kg   LMP 10/14/2021   BMI 40.19 kg/m   Physical Exam Constitutional:      Appearance: She is well-developed.  HENT:     Head: Normocephalic.  Cardiovascular:     Rate and Rhythm: Normal rate and regular rhythm.     Heart sounds: Normal heart sounds.  Pulmonary:     Effort: Pulmonary effort is normal.     Breath sounds: No rales.  Abdominal:     Tenderness: There is no abdominal tenderness.     Comments: Gravid, retaining fluid in lower abdomen   Musculoskeletal:     Cervical back: Normal range of motion.     Comments: Trace BLE   Neurological:      Mental Status: She is alert.     Deep Tendon Reflexes: Reflexes normal.  Skin:    General: Skin is warm and dry.  Psychiatric:        Mood and Affect: Mood normal.   EFM baseline 135, moderate variability, pos accel, neg decel  Toco: occasional  Consults: None  Significant Findings/ Diagnostic Studies: preeclampsia WNL   Procedures: RNST   Hospital Course: The patient was admitted to Labor and Delivery Triage for observation. She had two readings with diastolic in the  90's, . She did have an elevated BP on 8/18, so now has GHTN. Her preeclampsia labs were WNL.  She had a RNST.  She was given 2 Fioricet and 475m magnesium Oxide, her headache has come down from a "2/10" to a "1/10" and she states she feels better. Reports her nausea has calmed down now that the headache has improved. Discussed plan of care. For GHTN she will need a weekly BPP, NST, and labs. An order for MFM has been placed. Please discuss with Dr JLangley Gaussthe best plan for transverse lie ECV versus scheduled c/s.  Timing of birth will depend on  the clinical picture with the goal of getting to 37 weeks, and perhaps a little beyond. Pt and her partner verbalized understanding of the plan.  Pt asked when she should stop working, discussed bed rest is  not reccommended. She may choose to start maternity care early, but this may cut into her time with the baby. Pt states she has an option to work from home. Encouraged pt to discuss further at her  next ROB.   Discharge Condition: stable  Disposition: Discharge disposition: 01-Home or Self Care       Diet: Regular diet  Discharge Activity: Activity as tolerated   Allergies as of 06/18/2022       Reactions   Sertraline Other (See Comments)   Dizziness, sweats, panic   Augmentin [amoxicillin-pot Clavulanate] Diarrhea, Nausea And Vomiting        Medication List     TAKE these medications    Accu-Chek Aviva Plus test strip Generic drug: glucose blood Use  as instructed   Accu-Chek Aviva Plus w/Device Kit Check blood sugar 4 times daily; fasting and 2 hours after each meal   Accu-Chek Softclix Lancets lancets Use as instructed   aspirin EC 81 MG tablet Take 81 mg by mouth daily.   cetirizine 10 MG tablet Commonly known as: ZYRTEC Take 10 mg by mouth daily.   famotidine 20 MG tablet Commonly known as: PEPCID Take 20 mg by mouth 2 (two) times daily.   FreeStyle Libre 3 Sensor Misc Place 1 sensor on the skin every 14 days. Use to check glucose continuously   multivitamin-prenatal 27-0.8 MG Tabs tablet Take 1 tablet by mouth daily at 12 noon.   venlafaxine 75 MG tablet Commonly known as: EFFEXOR Take 75 mg by mouth daily.         Signed:Jillene BucksDOMINIC, CNM  06/18/2022, 7:00 PM

## 2022-06-18 NOTE — Progress Notes (Signed)
Pt with GHTN, GDM diet controlled, and BMI 40, needs weekly BPP, referral for MFM placed Carie Caddy, CNM  Domingo Pulse, [facility]  06/18/22  6:39 PM

## 2022-06-18 NOTE — Discharge Instructions (Signed)
To prevent Headache Magnesium Oxide 400mg  daily Riboflavin 400mg  daily Co enzyme Q 10 150mg  daily  For a bad headache 2 extra strength Tylenol, 1-2 Benadryl, 1 can of coke .

## 2022-06-20 ENCOUNTER — Encounter: Payer: Self-pay | Admitting: Licensed Practical Nurse

## 2022-06-20 ENCOUNTER — Encounter: Payer: 59 | Admitting: Obstetrics

## 2022-06-20 ENCOUNTER — Other Ambulatory Visit: Payer: Self-pay

## 2022-06-20 DIAGNOSIS — R519 Headache, unspecified: Secondary | ICD-10-CM

## 2022-06-20 DIAGNOSIS — O133 Gestational [pregnancy-induced] hypertension without significant proteinuria, third trimester: Secondary | ICD-10-CM

## 2022-06-20 DIAGNOSIS — O99213 Obesity complicating pregnancy, third trimester: Secondary | ICD-10-CM

## 2022-06-20 DIAGNOSIS — O24410 Gestational diabetes mellitus in pregnancy, diet controlled: Secondary | ICD-10-CM

## 2022-06-20 NOTE — Telephone Encounter (Signed)
Left voicemail to return call. 

## 2022-06-20 NOTE — Telephone Encounter (Signed)
Notified patient per Dr. Valentino Saxon. NST can be done in office and if BPP is urgently needed, can be performed in the hospital as well. Dr. Valentino Saxon has placed a call to MFM. We will contact patient back to notify of plan of care/schedule appointments.

## 2022-06-20 NOTE — Telephone Encounter (Addendum)
Spoke with patient advised per Dr. Valentino Saxon  MFM will work to get her in next week for a scan since she will be do for one anyways for her diabetes. NST in the office tomorrow. We'll watch her until 37 weeks, or until she fully declares herself with pre-eclampsia (labs or signficant BPs). She won't require weekly BPPs. Patient reports she just got off phone with MFM. She will see them for ultrasound (BPP). I scheduled her for NST at 8:15 am tomorrow.

## 2022-06-21 ENCOUNTER — Ambulatory Visit (INDEPENDENT_AMBULATORY_CARE_PROVIDER_SITE_OTHER): Payer: 59 | Admitting: Obstetrics and Gynecology

## 2022-06-21 VITALS — BP 130/78 | Wt 251.0 lb

## 2022-06-21 DIAGNOSIS — E669 Obesity, unspecified: Secondary | ICD-10-CM

## 2022-06-21 DIAGNOSIS — Z3400 Encounter for supervision of normal first pregnancy, unspecified trimester: Secondary | ICD-10-CM

## 2022-06-21 DIAGNOSIS — O26899 Other specified pregnancy related conditions, unspecified trimester: Secondary | ICD-10-CM

## 2022-06-21 DIAGNOSIS — O99213 Obesity complicating pregnancy, third trimester: Secondary | ICD-10-CM

## 2022-06-21 DIAGNOSIS — O322XX0 Maternal care for transverse and oblique lie, not applicable or unspecified: Secondary | ICD-10-CM

## 2022-06-21 DIAGNOSIS — O133 Gestational [pregnancy-induced] hypertension without significant proteinuria, third trimester: Secondary | ICD-10-CM

## 2022-06-21 DIAGNOSIS — Z3A35 35 weeks gestation of pregnancy: Secondary | ICD-10-CM

## 2022-06-21 LAB — POCT URINALYSIS DIPSTICK OB
Appearance: NORMAL
Bilirubin, UA: NEGATIVE
Blood, UA: NEGATIVE
Glucose, UA: NEGATIVE
Ketones, UA: NEGATIVE
Leukocytes, UA: NEGATIVE
Nitrite, UA: NEGATIVE
Odor: NORMAL
Spec Grav, UA: 1.02 (ref 1.010–1.025)
Urobilinogen, UA: 0.2 E.U./dL
pH, UA: 6 (ref 5.0–8.0)

## 2022-06-21 NOTE — Progress Notes (Signed)
Presents today for NST.  Reactive.  Mild hypertension today.  No headache.  Plan follow-up on Tuesday with MFM as previously scheduled.

## 2022-06-21 NOTE — Progress Notes (Signed)
Subjective:    Audrey Clarke is a 31 y.o. female who presents for fetal monitoring per order from Dr Valentino Saxon.    Results reviewed with Dr Logan Bores and discussed with patient.    PLAN: Patient will see MFM 06/25/22. Return in 1 week for ROB/NST.

## 2022-06-24 ENCOUNTER — Encounter: Payer: 59 | Admitting: Obstetrics & Gynecology

## 2022-06-24 ENCOUNTER — Other Ambulatory Visit: Payer: Self-pay | Admitting: Obstetrics and Gynecology

## 2022-06-24 DIAGNOSIS — O99213 Obesity complicating pregnancy, third trimester: Secondary | ICD-10-CM

## 2022-06-24 DIAGNOSIS — O133 Gestational [pregnancy-induced] hypertension without significant proteinuria, third trimester: Secondary | ICD-10-CM

## 2022-06-24 DIAGNOSIS — O24410 Gestational diabetes mellitus in pregnancy, diet controlled: Secondary | ICD-10-CM

## 2022-06-25 ENCOUNTER — Encounter: Payer: Self-pay | Admitting: Obstetrics and Gynecology

## 2022-06-25 ENCOUNTER — Inpatient Hospital Stay: Payer: 59

## 2022-06-25 ENCOUNTER — Inpatient Hospital Stay (HOSPITAL_BASED_OUTPATIENT_CLINIC_OR_DEPARTMENT_OTHER): Payer: 59 | Admitting: Maternal & Fetal Medicine

## 2022-06-25 ENCOUNTER — Other Ambulatory Visit: Payer: Self-pay

## 2022-06-25 ENCOUNTER — Ambulatory Visit (HOSPITAL_BASED_OUTPATIENT_CLINIC_OR_DEPARTMENT_OTHER): Payer: 59

## 2022-06-25 ENCOUNTER — Observation Stay
Admission: RE | Admit: 2022-06-25 | Discharge: 2022-06-25 | Disposition: A | Discharge status: Home / Self Care | Payer: 59 | Attending: Obstetrics and Gynecology | Admitting: Obstetrics and Gynecology

## 2022-06-25 VITALS — BP 137/91 | HR 105 | Temp 98.5°F | Ht 66.0 in | Wt 249.0 lb

## 2022-06-25 DIAGNOSIS — O24410 Gestational diabetes mellitus in pregnancy, diet controlled: Secondary | ICD-10-CM | POA: Insufficient documentation

## 2022-06-25 DIAGNOSIS — O403XX0 Polyhydramnios, third trimester, not applicable or unspecified: Secondary | ICD-10-CM | POA: Insufficient documentation

## 2022-06-25 DIAGNOSIS — O10913 Unspecified pre-existing hypertension complicating pregnancy, third trimester: Secondary | ICD-10-CM | POA: Insufficient documentation

## 2022-06-25 DIAGNOSIS — O368130 Decreased fetal movements, third trimester, not applicable or unspecified: Secondary | ICD-10-CM | POA: Insufficient documentation

## 2022-06-25 DIAGNOSIS — O285 Abnormal chromosomal and genetic finding on antenatal screening of mother: Secondary | ICD-10-CM | POA: Diagnosis not present

## 2022-06-25 DIAGNOSIS — O99820 Streptococcus B carrier state complicating pregnancy: Secondary | ICD-10-CM

## 2022-06-25 DIAGNOSIS — O322XX0 Maternal care for transverse and oblique lie, not applicable or unspecified: Secondary | ICD-10-CM

## 2022-06-25 DIAGNOSIS — R519 Headache, unspecified: Secondary | ICD-10-CM | POA: Insufficient documentation

## 2022-06-25 DIAGNOSIS — O24419 Gestational diabetes mellitus in pregnancy, unspecified control: Secondary | ICD-10-CM | POA: Insufficient documentation

## 2022-06-25 DIAGNOSIS — Z363 Encounter for antenatal screening for malformations: Secondary | ICD-10-CM | POA: Insufficient documentation

## 2022-06-25 DIAGNOSIS — Z7982 Long term (current) use of aspirin: Secondary | ICD-10-CM | POA: Diagnosis not present

## 2022-06-25 DIAGNOSIS — Z3A36 36 weeks gestation of pregnancy: Secondary | ICD-10-CM | POA: Insufficient documentation

## 2022-06-25 DIAGNOSIS — Z6791 Unspecified blood type, Rh negative: Secondary | ICD-10-CM

## 2022-06-25 DIAGNOSIS — O289 Unspecified abnormal findings on antenatal screening of mother: Secondary | ICD-10-CM

## 2022-06-25 DIAGNOSIS — O35BXX1 Maternal care for other (suspected) fetal abnormality and damage, fetal cardiac anomalies, fetus 1: Secondary | ICD-10-CM

## 2022-06-25 DIAGNOSIS — O99213 Obesity complicating pregnancy, third trimester: Secondary | ICD-10-CM

## 2022-06-25 DIAGNOSIS — O133 Gestational [pregnancy-induced] hypertension without significant proteinuria, third trimester: Secondary | ICD-10-CM | POA: Insufficient documentation

## 2022-06-25 DIAGNOSIS — O288 Other abnormal findings on antenatal screening of mother: Secondary | ICD-10-CM

## 2022-06-25 DIAGNOSIS — Z3400 Encounter for supervision of normal first pregnancy, unspecified trimester: Secondary | ICD-10-CM

## 2022-06-25 DIAGNOSIS — Z148 Genetic carrier of other disease: Secondary | ICD-10-CM

## 2022-06-25 DIAGNOSIS — O26893 Other specified pregnancy related conditions, third trimester: Secondary | ICD-10-CM | POA: Insufficient documentation

## 2022-06-25 LAB — PROTEIN / CREATININE RATIO, URINE
Creatinine, Urine: 129 mg/dL
Protein Creatinine Ratio: 0.2 mg/mg{Cre} — ABNORMAL HIGH (ref 0.00–0.15)
Total Protein, Urine: 26 mg/dL

## 2022-06-25 LAB — COMPREHENSIVE METABOLIC PANEL
ALT: 19 U/L (ref 0–44)
AST: 23 U/L (ref 15–41)
Albumin: 2.9 g/dL — ABNORMAL LOW (ref 3.5–5.0)
Alkaline Phosphatase: 130 U/L — ABNORMAL HIGH (ref 38–126)
Anion gap: 5 (ref 5–15)
BUN: 6 mg/dL (ref 6–20)
CO2: 19 mmol/L — ABNORMAL LOW (ref 22–32)
Calcium: 8.6 mg/dL — ABNORMAL LOW (ref 8.9–10.3)
Chloride: 113 mmol/L — ABNORMAL HIGH (ref 98–111)
Creatinine, Ser: 0.65 mg/dL (ref 0.44–1.00)
GFR, Estimated: >60 mL/min (ref >60)
Glucose, Bld: 110 mg/dL — ABNORMAL HIGH (ref 70–99)
Potassium: 3.5 mmol/L (ref 3.5–5.1)
Sodium: 137 mmol/L (ref 135–145)
Total Bilirubin: 0.7 mg/dL (ref 0.3–1.2)
Total Protein: 5.8 g/dL — ABNORMAL LOW (ref 6.5–8.1)

## 2022-06-25 LAB — TYPE AND SCREEN
ABO/RH(D): A NEG
Antibody Screen: POSITIVE

## 2022-06-25 LAB — CBC
HCT: 31.3 % — ABNORMAL LOW (ref 36.0–46.0)
Hemoglobin: 10.5 g/dL — ABNORMAL LOW (ref 12.0–15.0)
MCH: 31.9 pg (ref 26.0–34.0)
MCHC: 33.5 g/dL (ref 30.0–36.0)
MCV: 95.1 fL (ref 80.0–100.0)
Platelets: 175 10*3/uL (ref 150–400)
RBC: 3.29 MIL/uL — ABNORMAL LOW (ref 3.87–5.11)
RDW: 15.3 % (ref 11.5–15.5)
WBC: 8.5 10*3/uL (ref 4.0–10.5)
nRBC: 0 % (ref 0.0–0.2)

## 2022-06-25 MED ORDER — LACTATED RINGERS IV SOLN: INTRAVENOUS | Status: DC

## 2022-06-25 MED ORDER — OXYTOCIN BOLUS FROM INFUSION: 333.0000 mL | Freq: Once | INTRAVENOUS | Status: DC

## 2022-06-25 MED ORDER — SODIUM CHLORIDE 0.9 % IV SOLN: 500.0000 mg | INTRAVENOUS | Status: DC

## 2022-06-25 MED ORDER — SOD CITRATE-CITRIC ACID 500-334 MG/5ML PO SOLN: 30.0000 mL | ORAL | Status: DC

## 2022-06-25 MED ORDER — OXYTOCIN-SODIUM CHLORIDE 30-0.9 UT/500ML-% IV SOLN: 2.5000 [IU]/h | INTRAVENOUS | Status: DC

## 2022-06-25 MED ORDER — CEFAZOLIN SODIUM-DEXTROSE 2-4 GM/100ML-% IV SOLN: 2.0000 g | INTRAVENOUS | Status: DC

## 2022-06-25 MED ORDER — LACTATED RINGERS IV SOLN: 500.0000 mL | INTRAVENOUS | Status: DC | PRN

## 2022-06-25 NOTE — Progress Notes (Signed)
LABOR NOTE   Audrey Clarke 31 y.o.GP@ at [redacted]w[redacted]d  SUBJECTIVE:  Pt denies headache epigastric pain , and visual changes. Positive fetal movement noted with application of EFM. Pt state she is feeling the movement.   OBJECTIVE:  BP (!) 145/86   Pulse 94   Temp 99.7 F (37.6 C) (Oral)   Resp 16   Ht 5\' 6"  (1.676 m)   Wt 112.9 kg   LMP 10/14/2021   BMI 40.19 kg/m  No intake/output data recorded.  NST performed today was reviewed and was found to be reactive. Baseline 135 with Moderate variability; No decels noted.     Labs: Lab Results  Component Value Date   WBC 8.5 06/25/2022   HGB 10.5 (L) 06/25/2022   HCT 31.3 (L) 06/25/2022   MCV 95.1 06/25/2022   PLT 175 06/25/2022    ASSESSMENT:  Principal Problem:   Labor and delivery, indication for care   PLAN:  Given pt has reactive NST.  Dr. 06/27/2022 discussed plan of care with Dr. Logan Bores MFM. It proposed that we repeat BPP @ 1500 , if reassuring 8/8 discharge home with twice weekly testing and induction at 37 wks. Dr. Grace Clarke , the patient , and her family are in agreement to plan of care.   Order placed for repeat BPP  Audrey Clarke, CNM  06/25/2022

## 2022-06-25 NOTE — OB Triage Note (Signed)
Patient is a G1P0 at [redacted]w[redacted]d who was referred by MFM recommending delivery since BPP was 4/8. Reports +FM, occasional ctx, denies LOF and vaginal bleeding.

## 2022-06-25 NOTE — Discharge Summary (Signed)
Discharge Summary  Date of Service updated: 06/25/2022     Patient Name: Audrey Clarke DOB: [birth year] MRN: [id number]  Date of admission: 06/25/2022 Date of discharge: 06/25/2022  Admitting diagnosis: Labor and delivery, indication for care [O75.9], Gestational hypertension, Gestational diabetes diet controlled  Intrauterine pregnancy: [redacted]w[redacted]d    Secondary diagnosis:  Principal Problem:   Labor and delivery, indication for care  Additional problems: Pt seen in the AM with BPP 4/8, repeat BPP@ 1500 8/8 , reactive NST     Discharge diagnosis:  Same                                                Hospital course: NST reactive, repeat BPP 8/8, consult MFM (Dr. BGertie Exon in agreement for discharge with twice week antenatal testing until delivery , fetal kick counts. Plan for induction or c section on Wednesday 07/03/2022 @  37 wks.    Physical exam  Vitals:   06/25/22 1242 06/25/22 1257 06/25/22 1312 06/25/22 1327  BP: 123/76 136/85 135/79 (!) 145/86  Pulse: 98 99 (!) 102 94  Resp:      Temp:      TempSrc:      Weight:      Height:       General: alert, cooperative, and no distress Uterus: Gravid, soft , non tender   Labs: Lab Results  Component Value Date   WBC 8.5 06/25/2022   HGB 10.5 (L) 06/25/2022   HCT 31.3 (L) 06/25/2022   MCV 95.1 06/25/2022   PLT 175 06/25/2022      Latest Ref Rng & Units 06/25/2022   11:01 AM  CMP  Glucose 70 - 99 mg/dL 110   BUN 6 - 20 mg/dL 6   Creatinine 0.44 - 1.00 mg/dL 0.65   Sodium 135 - 145 mmol/L 137   Potassium 3.5 - 5.1 mmol/L 3.5   Chloride 98 - 111 mmol/L 113   CO2 22 - 32 mmol/L 19   Calcium 8.9 - 10.3 mg/dL 8.6   Total Protein 6.5 - 8.1 g/dL 5.8   Total Bilirubin 0.3 - 1.2 mg/dL 0.7   Alkaline Phos 38 - 126 U/L 130   AST 15 - 41 U/L 23   ALT 0 - 44 U/L 19     After visit meds:  Allergies as of 06/25/2022       Reactions   Sertraline Other (See Comments)   Dizziness, sweats, panic   Augmentin [amoxicillin-pot  Clavulanate] Diarrhea, Nausea And Vomiting        Medication List     TAKE these medications    Accu-Chek Aviva Plus test strip Generic drug: glucose blood Use as instructed   Accu-Chek Aviva Plus w/Device Kit Check blood sugar 4 times daily; fasting and 2 hours after each meal   Accu-Chek Softclix Lancets lancets Use as instructed   aspirin EC 81 MG tablet Take 81 mg by mouth daily.   cetirizine 10 MG tablet Commonly known as: ZYRTEC Take 10 mg by mouth daily.   co-enzyme Q-10 30 MG capsule Take 150 mg by mouth daily.   famotidine 20 MG tablet Commonly known as: PEPCID Take 20 mg by mouth 2 (two) times daily.   FreeStyle Libre 3 Sensor Misc Place 1 sensor on the skin every 14 days. Use to check glucose continuously   magnesium  oxide 400 (240 Mg) MG tablet Commonly known as: MAG-OX Take 400 mg by mouth daily.   multivitamin-prenatal 27-0.8 MG Tabs tablet Take 1 tablet by mouth daily at 12 noon.   Riboflavin 100 MG Caps Take 400 mg by mouth at bedtime.   venlafaxine 75 MG tablet Commonly known as: EFFEXOR Take 75 mg by mouth daily.         Discharge home in stable condition Discharge instruction: NST on Friday at [place] in the office, NST Monday 9/4 at the hospital , inductions /c section Wednesday 07/03/22. Daily kick counts.   Activity: Advance as tolerated.  Diet: routine diet  Future Appointments: Future Appointments  Date Time Provider [place]  06/28/2022  1:15 PM [facility] NST ROOM [facility] None  06/28/2022  1:55 PM Rod Can, CNM [facility] None  07/02/2022  8:35 AM Rosario Adie, MD [facility] None  07/08/2022  8:55 AM Dominic, Nunzio Cobbs, CNM [facility] None  07/15/2022  8:55 AM Emily Filbert, MD [facility] None  07/23/2022  8:55 AM Dominic, Nunzio Cobbs, CNM [facility] None   Follow up Visit:  Follow-up Information     [facility]. Go to.   Why: Labor and Delivery 07/01/22 at 10 for NST  Labor and Delivery 9/6  arrive at  10am for 1200 C/S  You will enter through the ER each time                    06/25/2022 Philip Aspen, CNM

## 2022-06-25 NOTE — Progress Notes (Signed)
MFM-Dr. [name] order to transport and admit pt to [facility] following her Ultrasound at the MFM clinic today.Dr. [name] spoke with the provider on call for [facility] and gave report prior to transportation via wheelchair to [facility].  Report given to Gregary Cromer, RN.

## 2022-06-25 NOTE — Progress Notes (Unsigned)
MFM Brief Note  Ms. Tsutsui is a 31 yo G1P0 at 9 w 2d she is here for a detailed exam at the request of Dr. Nicholaus Bloom.  She has been followed closely with concern for preeclampsia. Her most recent CBC, CMP and UPC is normal.  Today she reports decreased fetal movement over the weekend.  She also expresses some vision changes, but no headache.  Single intrauterine pregnancy here for a detailed anatomy Normal anatomy with measurements consistent with dates There is good fetal movement and polyhydramnios.  Suboptimal views of the fetal anatomy were obtained secondary to fetal position and advanced gestational age.  Biophysical profile 8/8 (-2 for movement and tone).  I discussed with Ms. Segler today's findings of elevated blood pressure and concerning BPP. We recommend going to L&D for delivery for non-reassuring testing and GHTN.  I discussed with Ms. Barnard that she may require a cesarean given fetal testing and transverse lie.  I dicussed this plan of care with Doreene Burke, CNM and Dr. Logan Bores  All questions answered.  I spent 20 minutes with > 50% in face to face consultation.  Novella Olive, MD

## 2022-06-25 NOTE — Plan of Care (Signed)
Patient with spotty tracing at times, in order to have continuous tracing must have hold [place] in place at bedside. Tracing does go down to maternal frequently and matches maternal pulse on the monitor. There is positive fetal movement and baby is very active. Unable to trace well while mother is on her side.

## 2022-06-25 NOTE — H&P (Signed)
History and Physical  Assessment/Plan:  Audrey Clarke is a 31 y.o. G1P0000 at [redacted]w[redacted]d 07/21/2022, by Last Menstrual Period who is being admitted for Gestational hypertensive, Gestational Diabetes -diet controled with concern for BPP 4/8 and polyhydramnios today.    1. Hypertension:  - Will evaluate cbc, cmp, p/c ratio  - Serial BP evaluation - Plan cesarean delivery given indication of transverse lie    2. Fetal well-being: FHT Category 2. - The patient was counseled on expected outcomes for her infant at this gestational age and NICU team was made aware of the patient's admission and status.   3. GBS: positive    History of Present Illness:   Chief Complaint - decreased fetal movement  Patient denies headache, epigastric pain , visual changes.   Fetal Movement: audible on FHT  Allergies  Sertraline  Augmentin -low severity, nausea/vomiting   Medications   Aspirin 81 mg daily  Prenatal vitamin daily  Effexor 75 mg po daily  Magnesium oxide 400 mg daily Riboflavin 400 mg q HS   OB History  OB History  Gravida Para Term Preterm AB Living  1 0 0 0 0 0  SAB IAB Ectopic Multiple Live Births  0 0 0 0 0    # Outcome Date GA Lbr Len/2nd Weight Sex Delivery Anes PTL Lv  1 Current              Past Medical History Anemia Depression History PCOS Irritable bowel syndrome Vitamin B 12 deficiency Vitamin D deficiency    Past Surgical History Tonsillectomy and adenoidectomy Wisdom tooth extraction   Past Social History:  Social History   Socioeconomic History   Marital status: Married    Spouse name: Lesleyann Fichter   Number of children: Not on file   Years of education: Not on file   Highest education level: Not on file  Occupational History   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  Tobacco Use   Smoking status: Never   Smokeless tobacco: Never  Vaping Use   Vaping Use: Never used  Substance and Sexual Activity   Alcohol use: Never    Drug use: Never   Sexual activity: Yes    Birth control/protection: Pill  Other Topics Concern   Not on file  Social History Narrative   Not on file   Social Determinants of Health   Financial Resource Strain: Not on file  Food Insecurity: Not on file  Transportation Needs: Not on file  Physical Activity: Not on file  Stress: Not on file  Social Connections: Not on file     Family History  family history includes Alzheimer's disease in her paternal grandfather; Asthma in her mother; Breast cancer (age of onset: 35) in her maternal aunt; Breast cancer (age of onset: 69) in her mother; Breast cancer (age of onset: 27) in her maternal grandmother; Diabetes in her maternal aunt, maternal grandfather, and paternal grandfather; Hypertension in her father, maternal aunt, mother, and paternal grandfather; Osteoarthritis in her mother; Prostate cancer in her maternal grandfather.   Review of Systems Review of Systems  Constitutional: Negative.   HENT: Negative.    Eyes: Negative.   Respiratory: Negative.    Cardiovascular: Negative.   Gastrointestinal:  Positive for nausea.       Pt has experienced increase in nausea in the past few days, denies currently   Genitourinary: Negative.   Musculoskeletal: Negative.   Skin: Negative.   Neurological:  Positive for headaches.  Pt state she has had mild headaches over the past few days but denies today  Psychiatric/Behavioral: Negative.        Vital Signs  Patient Vitals for the past 8 hrs:  BP Temp Temp src Pulse Resp Height Weight  06/25/22 1109 (!) 143/90 99.7 F (37.6 C) Oral 93 16 5\' 6"  (1.676 m) 112.9 kg   No intake/output data recorded.   Physical Exam  General Appearance No acute distress, well appearing and well nourished.  Pulmonary Easy work of breathing.  Cardiovascular Pulse normal rate, regularity and rhythm.   Abdomen Abdomen soft, gravid, no fundal tenderness, no rebound or guarding.  Genitourinary Vagina  and cervix without lesions, no bleeding.     SVE Deferred      Extremities No rash, lesions or petechiae. No bilateral cyanosis, clubbing.  Edema 1+  Neurologic Grossly intact  Psychiatric Appropriate affect    Test Results   CBC    Component Value Date/Time   WBC 8.5 06/25/2022 1101   RBC 3.29 (L) 06/25/2022 1101   HGB 10.5 (L) 06/25/2022 1101   HGB 10.4 (L) 06/17/2022 0950   HCT 31.3 (L) 06/25/2022 1101   HCT 31.2 (L) 06/17/2022 0950   PLT 175 06/25/2022 1101   PLT 160 06/17/2022 0950   MCV 95.1 06/25/2022 1101   MCV 99 (H) 06/17/2022 0950   MCH 31.9 06/25/2022 1101   MCHC 33.5 06/25/2022 1101   RDW 15.3 06/25/2022 1101   RDW 14.8 06/17/2022 0950   LYMPHSABS 1.6 06/18/2022 1620   LYMPHSABS 1.2 04/22/2022 0933   MONOABS 0.5 06/18/2022 1620   EOSABS 0.0 06/18/2022 1620   EOSABS 0.0 04/22/2022 0933   BASOSABS 0.0 06/18/2022 1620   BASOSABS 0.0 04/22/2022 0933   CMP     Component Value Date/Time   NA 137 06/25/2022 1101   K 3.5 06/25/2022 1101   CL 113 (H) 06/25/2022 1101   CO2 19 (L) 06/25/2022 1101   GLUCOSE 110 (H) 06/25/2022 1101   BUN 6 06/25/2022 1101   CREATININE 0.65 06/25/2022 1101   CALCIUM 8.6 (L) 06/25/2022 1101   PROT 5.8 (L) 06/25/2022 1101   ALBUMIN 2.9 (L) 06/25/2022 1101   AST 23 06/25/2022 1101   ALT 19 06/25/2022 1101   ALKPHOS 130 (H) 06/25/2022 1101   BILITOT 0.7 06/25/2022 1101   GFRNONAA >60 06/25/2022 1101   Status: Final result    Visible to patient: Yes (not seen)    Next appt: 06/28/2022 at 01:15 PM in Obstetrics and Gynecology ([facility] ROOM)    0 Result Notes       Component Ref Range & Units 11:01 7 d ago 11 d ago  Creatinine, Urine mg/dL 129  222  164   Total Protein, Urine mg/dL 26  31 CM  29 CM   Comment: NO NORMAL RANGE ESTABLISHED FOR THIS TEST  Protein Creatinine Ratio 0.00 - 0.15 mg/mg 0.20    0.14 CM  0.18 High  CM        Imaging: MFM u/s 0915 06/25/2022 Indications  [redacted] weeks gestation of pregnancy                 Z3A.36  Gestational diabetes in pregnancy, diet        O24.410  controlled  Hypertension - Gestational                     O13.9  Antenatal screening for malformations  Z36.3  LR-NIPS  Genetic carrier (SMA, 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 Cord Insertion:      Visualized  Amniotic Fluid  AFI FV:      Polyhydramnios  AFI Sum(cm)     %Tile       Largest Pocket(cm)  29.25           > 97        8.92  RUQ(cm)       RLQ(cm)       LUQ(cm)        LLQ(cm)  6.88          8.22          8.92           5.23 ---------------------------------------------------------------------- Biophysical Evaluation  Amniotic F.V:   Within normal limits       F. Tone:        Not Observed  F. Movement:    Not Observed               Score:          4/8  F. Breathing:   Observed ---------------------------------------------------------------------- Biometry  BPD:     90.14  mm     G. Age:  36w 4d         67  %    CI:        75.46   %    70 - 86                                                          FL/HC:      21.8   %    20.1 - 22.1  HC:    329.06   mm     G. Age:  37w 3d         48  %    HC/AC:      0.97        0.93 - 1.11  AC:    338.26   mm     G. Age:  37w 5d         92  %    FL/BPD:     79.5   %    71 - 87  FL:      71.65  mm     G. Age:  36w 5d         58  %    FL/AC:      21.2   %    20 - 24  LV:        2.7  mm  Est. FW:    3174  gm           7 lb     79  % ---------------------------------------------------------------------- OB History  Gravidity:    1  Term:   0        Prem:   0        SAB:   0  TOP:          0       Ectopic:  0        Living:  0 ---------------------------------------------------------------------- Gestational Age  LMP:           36w 2d        Date:  10/14/21                 EDD:   07/21/22  U/S Today:     37w 1d                                        EDD:   07/15/22  Best:          36w 2d     Det. By:  LMP  (10/14/21)          EDD:   07/21/22 ---------------------------------------------------------------------- Anatomy  Cranium:               Appears normal         Abdomen:                Appears normal  Ventricles:            Appears normal         Kidneys:                Appear normal  Diaphragm:             Appears normal         Bladder:                Appears normal  Stomach:               Appears normal, left                         sided ---------------------------------------------------------------------- Cervix Uterus Adnexa  Cervix  Length:            3.7  cm.  Normal appearance by transabdominal scan.  Uterus  No abnormality visualized.  Right Ovary  Not visualized.  Left Ovary  Not visualized. ---------------------------------------------------------------------- Impression  MFM Brief Note  Ms. Wyler is a 31 yo G1P0 at 44 w 2d she is here for a  detailed exam at the request of Dr. Clovia Cuff.  She has been followed closely with concern for preeclampsia.  Her most recent CBC, CMP and UPC is normal.  Today she reports decreased fetal movement over the  weekend.  She also expresses some vision changes, but no headache.  Single intrauterine pregnancy here for a detailed anatomy  Normal anatomy with measurements consistent with dates  There is good fetal movement and polyhydramnios.  Suboptimal views of the fetal anatomy were obtained  secondary to fetal position and advanced gestational age.  Biophysical profile 8/8 (-2 for movement and tone).  I discussed with Ms. Bogosian today's findings of elevated blood  pressure and concerning BPP. We recommend going to L&D  for delivery for  non-reassuring testing and GHTN.  I discussed with Ms. Skotnicki that she may require a cesarean  given fetal testing and transverse lie.  I dicussed this plan of care with Philip Aspen, CNM and  Dr.  Evans  All questions answered.  I spent 20 minutes with > 50% in face to face consultation.  Novella Olive, MD     Doreene Burke, CNM  06/25/2022 11:29 AM

## 2022-06-25 NOTE — Progress Notes (Signed)
Patient given discharge instructions and verbalized understanding. She understands when to return to the hospital.

## 2022-06-26 LAB — RPR: RPR Ser Ql: NONREACTIVE

## 2022-06-27 ENCOUNTER — Other Ambulatory Visit: Payer: 59

## 2022-06-28 ENCOUNTER — Ambulatory Visit (INDEPENDENT_AMBULATORY_CARE_PROVIDER_SITE_OTHER): Payer: 59 | Admitting: Advanced Practice Midwife

## 2022-06-28 VITALS — BP 140/90 | HR 100 | Wt 251.0 lb

## 2022-06-28 DIAGNOSIS — O0993 Supervision of high risk pregnancy, unspecified, third trimester: Secondary | ICD-10-CM

## 2022-06-28 DIAGNOSIS — O99213 Obesity complicating pregnancy, third trimester: Secondary | ICD-10-CM

## 2022-06-28 DIAGNOSIS — Z3400 Encounter for supervision of normal first pregnancy, unspecified trimester: Secondary | ICD-10-CM

## 2022-06-28 DIAGNOSIS — Z3A36 36 weeks gestation of pregnancy: Secondary | ICD-10-CM

## 2022-06-28 DIAGNOSIS — O133 Gestational [pregnancy-induced] hypertension without significant proteinuria, third trimester: Secondary | ICD-10-CM | POA: Diagnosis not present

## 2022-06-28 DIAGNOSIS — O24410 Gestational diabetes mellitus in pregnancy, diet controlled: Secondary | ICD-10-CM

## 2022-06-28 LAB — POCT URINALYSIS DIPSTICK OB: Glucose, UA: NEGATIVE

## 2022-06-28 NOTE — Progress Notes (Signed)
Routine Prenatal Care Visit  Subjective  Audrey Clarke is a 31 y.o. G1P0000 at [redacted]w[redacted]d being seen today for ongoing prenatal care.  She is currently monitored for the following issues for this high-risk pregnancy and has Supervision of normal first pregnancy, antepartum; Anxiety with depression; Chronic migraine without aura without status migrainosus, not intractable; Obesity affecting pregnancy; Rh negative state in antepartum period; Abnormal glucose tolerance in pregnancy; GDM (gestational diabetes mellitus); Elevated blood pressure affecting pregnancy in third trimester, antepartum; GBS (group B Streptococcus carrier), +RV culture, currently pregnant; Headache in pregnancy; Gestational hypertension; Transverse lie of fetus; Labor and delivery, indication for care; Family history of breast cancer; Psoriasis; and Irritable bowel syndrome with constipation on their problem list.  ----------------------------------------------------------------------------------- Patient reports no complaints. Reviewed c/s procedure in detail. She is worried that she may be overly anxious at the time of procedure and need anti anxiety medication.  She reports blood sugars are well controlled. She denies headache, visual changes or epigastric pain. Contractions: Not present. Vag. Bleeding: None.  Movement: Present. Leaking Fluid denies.  ----------------------------------------------------------------------------------- The following portions of the patient's history were reviewed and updated as appropriate: allergies, current medications, past family history, past medical history, past social history, past surgical history and problem list. Problem list updated.  Objective  Blood pressure (!) 140/90, pulse 100, weight 251 lb (113.9 kg), last menstrual period 10/14/2021. Pregravid weight 239 lb (108.4 kg) Total Weight Gain 12 lb (5.443 kg) Urinalysis: Urine Protein Trace  Urine Glucose Negative  Fetal Status:  Fetal Heart Rate (bpm): 140   Movement: Present      NST: reactive 20 minute tracing, 140 bpm, moderate variability, +accelerations, -decelerations  General:  Alert, oriented and cooperative. Patient is in no acute distress.  Skin: Skin is warm and dry. No rash noted.   Cardiovascular: Normal heart rate noted  Respiratory: Normal respiratory effort, no problems with respiration noted  Abdomen: Soft, gravid, appropriate for gestational age. Pain/Pressure: Present     Pelvic:  Cervical exam deferred        Extremities: Normal range of motion.  Edema: Trace  Mental Status: Normal mood and affect. Normal behavior. Normal judgment and thought content.   Assessment   30 y.o. G1P0000 at [redacted]w[redacted]d by  07/21/2022, by Last Menstrual Period presenting for routine prenatal visit  Plan   pregnancy 1 Problems (from 12/12/21 to present)    Problem Noted Resolved   Gestational hypertension 06/18/2022 by Ellwood Sayers, CNM No   Transverse lie of fetus 06/18/2022 by Ellwood Sayers, CNM No   Rh negative state in antepartum period 03/20/2022 by Catalina Antigua, MD No   Obesity affecting pregnancy 12/27/2021 by Tresea Mall, CNM No   Supervision of normal first pregnancy, antepartum 12/12/2021 by Natale Milch, MD No   Overview Addendum 06/17/2022 10:31 PM by Mirna Mires, CNM     Nursing Staff Provider  Office Location  [facility] Dating  [redacted]w[redacted]d on 2/27   Language  English Anatomy US  Incomplete views 4/28, repeated nml  Flu Vaccine   Genetic Screen  NIPS: neg, female   TDaP vaccine   04/22/22 Hgb A1C or  GTT Early : Third trimester :   Covid vaccinated   LAB RESULTS   Rhogam  04/22/22 Blood Type A/Negative/-- (02/15 1541)   Feeding Plan  Antibody Negative (02/15 1541)  Contraception  Rubella 1.59 (02/15 1541)  Circumcision  RPR Non Reactive (02/15 1541)   Pediatrician   HBsAg Negative (02/15 1541)  Support Person Ian Malkin  HIV Non Reactive (02/15 1541)  Prenatal Classes  Varicella  immune    GBS  (For PCN allergy, check sensitivities) positive  BTL Consent     VBAC Consent  Pap  2022 NIL    Hgb Electro      CF      SMA                   Preterm labor symptoms and general obstetric precautions including but not limited to vaginal bleeding, contractions, leaking of fluid and fetal movement were reviewed in detail with the patient. Please refer to After Visit Summary for other counseling recommendations.   Return for c/s in 5 days.  Tresea Mall, CNM 06/28/2022 2:04 PM

## 2022-07-01 ENCOUNTER — Observation Stay (HOSPITAL_BASED_OUTPATIENT_CLINIC_OR_DEPARTMENT_OTHER)
Admission: EM | Admit: 2022-07-01 | Discharge: 2022-07-01 | Disposition: A | Discharge status: Home / Self Care | Payer: 59 | Source: Home / Self Care | Admitting: Advanced Practice Midwife

## 2022-07-01 DIAGNOSIS — O133 Gestational [pregnancy-induced] hypertension without significant proteinuria, third trimester: Secondary | ICD-10-CM | POA: Insufficient documentation

## 2022-07-01 DIAGNOSIS — Z7982 Long term (current) use of aspirin: Secondary | ICD-10-CM | POA: Insufficient documentation

## 2022-07-01 DIAGNOSIS — O1203 Gestational edema, third trimester: Secondary | ICD-10-CM | POA: Insufficient documentation

## 2022-07-01 DIAGNOSIS — Z3689 Encounter for other specified antenatal screening: Secondary | ICD-10-CM | POA: Insufficient documentation

## 2022-07-01 DIAGNOSIS — Z79899 Other long term (current) drug therapy: Secondary | ICD-10-CM | POA: Insufficient documentation

## 2022-07-01 DIAGNOSIS — O24410 Gestational diabetes mellitus in pregnancy, diet controlled: Secondary | ICD-10-CM

## 2022-07-01 DIAGNOSIS — Z3A37 37 weeks gestation of pregnancy: Secondary | ICD-10-CM

## 2022-07-01 DIAGNOSIS — O24419 Gestational diabetes mellitus in pregnancy, unspecified control: Secondary | ICD-10-CM | POA: Insufficient documentation

## 2022-07-01 NOTE — OB Triage Note (Signed)
Here for scheduled NST.

## 2022-07-01 NOTE — Discharge Summary (Signed)
Physician Final Progress Note  Patient ID: Audrey Clarke MRN: [id number] DOB/AGE: Mar 20, 1991 31 y.o.  Admit date: 07/01/2022 Admitting provider: Rod Can, CNM Discharge date: 07/01/2022   Admission Diagnoses:  1) intrauterine pregnancy at [redacted]w[redacted]d 2) scheduled NST (gHTN, GDM A2)  Discharge Diagnoses:  Principal Problem:   Labor and delivery, indication for care Active Problems:   [redacted] weeks gestation of pregnancy    History of Present Illness: The patient is a 31y.o. female G1P0000 at 346w1dho presents for scheduled NST prior to c/section that is scheduled for Wednesday of this week. She has no complaints to day. Positive fetal movement. Negative: contractions, leakage of fluid, vaginal bleeding, headache, visual changes, epigastric pain. NST is reactive. No contractions on monitor.   Past Medical History:  Diagnosis Date   Anemia    BRCA negative 07/2019   MyRisk neg   Depression    Family history of breast cancer    Gestational diabetes    History of PCOS    IBS (irritable bowel syndrome)    Increased risk of breast cancer 07/2019   IBIS=27.7%/riskscore=22.9%   PCOS (polycystic ovarian syndrome)    Vitamin B12 deficiency    Vitamin D deficiency     Past Surgical History:  Procedure Laterality Date   TONSILLECTOMY AND ADENOIDECTOMY     WISDOM TOOTH EXTRACTION      No current facility-administered medications on file prior to encounter.   Current Outpatient Medications on File Prior to Encounter  Medication Sig Dispense Refill   Accu-Chek Softclix Lancets lancets Use as instructed 100 each 12   aspirin EC 81 MG tablet Take 81 mg by mouth daily.     Blood Glucose Monitoring Suppl (ACCU-CHEK AVIVA PLUS) w/Device KIT Check blood sugar 4 times daily; fasting and 2 hours after each meal 1 kit 0   cetirizine (ZYRTEC) 10 MG tablet Take 10 mg by mouth daily.     co-enzyme Q-10 30 MG capsule Take 150 mg by mouth daily.     famotidine (PEPCID) 20 MG tablet Take  20 mg by mouth 2 (two) times daily.     glucose blood (ACCU-CHEK AVIVA PLUS) test strip Use as instructed 100 each 12   magnesium oxide (MAG-OX) 400 (240 Mg) MG tablet Take 400 mg by mouth daily.     Prenatal Vit-Fe Fumarate-FA (MULTIVITAMIN-PRENATAL) 27-0.8 MG TABS tablet Take 1 tablet by mouth daily at 12 noon.     Riboflavin 100 MG CAPS Take 400 mg by mouth at bedtime.     venlafaxine (EFFEXOR) 75 MG tablet Take 75 mg by mouth daily.     Continuous Blood Gluc Sensor (FREESTYLE LIBRE 3 SENSOR) MISC Place 1 sensor on the skin every 14 days. 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  Comment[place] [employer]   Smoking status: Never   Smokeless tobacco: Never  Vaping Use   Vaping Use: Never used  Substance and Sexual Activity   Alcohol  use: Never   Drug use: Never   Sexual activity: Yes    Birth control/protection: Pill  Other Topics Concern   Not on file  Social History Narrative   Not on file   Social Determinants of Health   Financial Resource Strain: Not on file  Food Insecurity: Not on file  Transportation Needs: Not on file  Physical Activity: Not on file  Stress: Not on file  Social Connections: Not on file  Intimate Partner Violence: Not on file    Family History  Problem Relation Age of Onset   Asthma Mother    Hypertension Mother    Osteoarthritis Mother    Breast cancer Mother 52   Hypertension Father    Breast cancer Maternal Grandmother 40   Diabetes Maternal Grandfather     Prostate cancer Maternal Grandfather    Diabetes Paternal Grandfather    Hypertension Paternal Grandfather    Alzheimer's disease Paternal Grandfather    Hypertension Maternal Aunt    Diabetes Maternal Aunt    Breast cancer Maternal Aunt 45     Review of Systems  Constitutional:  Negative for chills and fever.  HENT:  Negative for congestion, ear discharge, ear pain, hearing loss, sinus pain and sore throat.   Eyes:  Negative for blurred vision and double vision.  Respiratory:  Negative for cough, shortness of breath and wheezing.   Cardiovascular:  Negative for chest pain, palpitations and leg swelling.  Gastrointestinal:  Negative for abdominal pain, blood in stool, constipation, diarrhea, heartburn, melena, nausea and vomiting.  Genitourinary:  Negative for dysuria, flank pain, frequency, hematuria and urgency.  Musculoskeletal:  Negative for back pain, joint pain and myalgias.  Skin:  Negative for itching and rash.  Neurological:  Negative for dizziness, tingling, tremors, sensory change, speech change, focal weakness, seizures, loss of consciousness, weakness and headaches.  Endo/Heme/Allergies:  Negative for environmental allergies. Does not bruise/bleed easily.  Psychiatric/Behavioral:  Negative for depression, hallucinations, memory loss, substance abuse and suicidal ideas. The patient is not nervous/anxious and does not have insomnia.      Physical Exam: BP (!) 141/97   Pulse 86   LMP 10/14/2021   Constitutional: Well nourished, well developed female in no acute distress.  HEENT: normal Skin: Warm and dry.  Cardiovascular: Regular rate and rhythm.   Extremity:  trace edema   Respiratory: Clear to auscultation bilateral. Normal respiratory effort Abdomen: FHT present Back: no CVAT Neuro: DTRs 2+, Cranial nerves grossly intact Psych: Alert and Oriented x3. No memory deficits. Normal mood and affect.   Toco: negative Fetal well being: reactive NST, 135 bpm, moderate  variability, +accelerations, -decelerations  Consults: None  Significant Findings/ Diagnostic Studies: labs: none  Procedures: NST  Hospital Course: The patient was admitted to Labor and Delivery Triage for observation.   Discharge Condition: good  Disposition: Discharge disposition: 01-Home or Self Care  Diet: Diabetic diet  Discharge Activity: Activity as tolerated  Discharge Instructions     Discharge activity:  No Restrictions   Complete by: As directed    Discharge diet:   Complete by: As directed    Carb modified diet      Allergies as of 07/01/2022       Reactions   Sertraline Other (See Comments)   Dizziness, sweats, panic   Augmentin [amoxicillin-pot Clavulanate] Diarrhea, Nausea And Vomiting        Medication List     STOP taking these medications    FreeStyle Libre 3 Sensor Misc  TAKE these medications    Accu-Chek Aviva Plus test strip Generic drug: glucose blood Use as instructed   Accu-Chek Aviva Plus w/Device Kit Check blood sugar 4 times daily; fasting and 2 hours after each meal   Accu-Chek Softclix Lancets lancets Use as instructed   aspirin EC 81 MG tablet Take 81 mg by mouth daily.   cetirizine 10 MG tablet Commonly known as: ZYRTEC Take 10 mg by mouth daily.   co-enzyme Q-10 30 MG capsule Take 150 mg by mouth daily.   famotidine 20 MG tablet Commonly known as: PEPCID Take 20 mg by mouth 2 (two) times daily.   magnesium oxide 400 (240 Mg) MG tablet Commonly known as: MAG-OX Take 400 mg by mouth daily.   multivitamin-prenatal 27-0.8 MG Tabs tablet Take 1 tablet by mouth daily at 12 noon.   Riboflavin 100 MG Caps Take 400 mg by mouth at bedtime.   venlafaxine 75 MG tablet Commonly known as: EFFEXOR Take 75 mg by mouth daily.         Total time spent taking care of this patient: 19 minutes  Signed: Rod Can, CNM  07/01/2022, 10:57 AM

## 2022-07-02 ENCOUNTER — Other Ambulatory Visit: Payer: Self-pay | Admitting: Obstetrics and Gynecology

## 2022-07-02 ENCOUNTER — Other Ambulatory Visit: Payer: Self-pay

## 2022-07-02 ENCOUNTER — Encounter: Payer: Self-pay | Admitting: Obstetrics and Gynecology

## 2022-07-02 ENCOUNTER — Encounter: Payer: Self-pay | Admitting: Advanced Practice Midwife

## 2022-07-02 ENCOUNTER — Encounter
Admission: RE | Admit: 2022-07-02 | Discharge: 2022-07-02 | Disposition: A | Discharge status: Home / Self Care | Payer: 59 | Source: Ambulatory Visit | Attending: Obstetrics and Gynecology | Admitting: Obstetrics and Gynecology

## 2022-07-02 ENCOUNTER — Encounter: Payer: 59 | Admitting: Obstetrics & Gynecology

## 2022-07-02 ENCOUNTER — Encounter: Payer: Self-pay | Admitting: Obstetrics & Gynecology

## 2022-07-02 VITALS — Ht 66.0 in | Wt 251.0 lb

## 2022-07-02 DIAGNOSIS — Z3A37 37 weeks gestation of pregnancy: Secondary | ICD-10-CM

## 2022-07-02 DIAGNOSIS — Z01818 Encounter for other preprocedural examination: Secondary | ICD-10-CM | POA: Insufficient documentation

## 2022-07-02 HISTORY — DX: Essential (primary) hypertension: I10

## 2022-07-02 LAB — TYPE AND SCREEN
ABO/RH(D): A NEG
Antibody Screen: POSITIVE
Extend sample reason: UNDETERMINED

## 2022-07-02 NOTE — Progress Notes (Signed)
This encounter was created in error - please disregard.

## 2022-07-02 NOTE — Patient Instructions (Addendum)
Your procedure is scheduled on: July 03, 2022 [facility] Report to the Registration Desk on the 1st floor of the [facility] at 10:00 am   REMEMBER: Instructions that are not followed completely may result in serious medical risk, up to and including death; or upon the discretion of your surgeon and anesthesiologist your surgery may need to be rescheduled.  Do not eat food OR DRINK ANYTHING after midnight the night before surgery.  No gum chewing, lozengers or hard candies.   TAKE THESE MEDICATIONS THE MORNING OF SURGERY WITH A SIP OF WATER: NONE  STOP ASPIRIN  One week prior to surgery: Stop Anti-inflammatories (NSAIDS) such as Advil, Aleve, Ibuprofen, Motrin, Naproxen, Naprosyn and Aspirin based products such as Excedrin, Goodys Powder, BC Powder. Stop ANY OVER THE COUNTER supplements until after surgery. You may however, continue to take Tylenol if needed for pain up until the day of surgery.  No Alcohol for 24 hours before or after surgery.  No Smoking including e-cigarettes for 24 hours prior to surgery.  No chewable tobacco products for at least 6 hours prior to surgery.  No nicotine patches on the day of surgery.  Do not use any "recreational" drugs for at least a week prior to your surgery.  Please be advised that the combination of cocaine and anesthesia may have negative outcomes, up to and including death. If you test positive for cocaine, your surgery will be cancelled.  On the morning of surgery brush your teeth with toothpaste and water, you may rinse your mouth with mouthwash if you wish. Do not swallow any toothpaste or mouthwash.  Use CHG  wipes as directed on instruction sheet.  Do not wear jewelry, make-up, hairpins, clips or nail polish.  Do not wear lotions, powders, or perfumes.   Do not shave body from the neck down 48 hours prior to surgery just in case you cut yourself which could leave a site for infection.  Also, freshly shaved skin may become  irritated if using the CHG soap.  Contact lenses, hearing aids and dentures may not be worn into surgery.  Do not bring valuables to the hospital. [facility] is not responsible for any missing/lost belongings or valuables.   Notify your doctor if there is any change in your medical condition (cold, fever, infection).  Wear comfortable clothing (specific to your surgery type) to the hospital.  After surgery, you can help prevent lung complications by doing breathing exercises.  Take deep breaths and cough every 1-2 hours. Your doctor may order a device called an Incentive Spirometer to help you take deep breaths. When coughing or sneezing, hold a pillow firmly against your incision with both hands. This is called "splinting." Doing this helps protect your incision. It also decreases belly discomfort.  If you are being admitted to the hospital overnight, leave your suitcase in the car. After surgery it may be brought to your room.  If you are being discharged the day of surgery, you will not be allowed to drive home. You will need a responsible adult (18 years or older) to drive you home and stay with you that night.   If you are taking public transportation, you will need to have a responsible adult (18 years or older) with you. Please confirm with your physician that it is acceptable to use public transportation.   Please call the Pre-admissions Testing Dept. at [phone] if you have any questions about these instructions.  Surgery Visitation Policy:  Patients undergoing a surgery  or procedure may have two family members or support persons with them as long as the person is not COVID-19 positive or experiencing its symptoms.

## 2022-07-03 ENCOUNTER — Encounter: Payer: Self-pay | Admitting: Obstetrics and Gynecology

## 2022-07-03 ENCOUNTER — Other Ambulatory Visit: Payer: Self-pay

## 2022-07-03 ENCOUNTER — Inpatient Hospital Stay: Payer: 59 | Admitting: Certified Registered Nurse Anesthetist

## 2022-07-03 ENCOUNTER — Encounter
Admission: RE | Disposition: A | Discharge status: Home / Self Care | Payer: Self-pay | Source: Home / Self Care | Attending: Obstetrics and Gynecology

## 2022-07-03 ENCOUNTER — Inpatient Hospital Stay
Admission: RE | Admit: 2022-07-03 | Discharge: 2022-07-06 | DRG: 787 | Disposition: A | Discharge status: Home / Self Care | Payer: 59 | Attending: Obstetrics and Gynecology | Admitting: Obstetrics and Gynecology

## 2022-07-03 DIAGNOSIS — Z3A37 37 weeks gestation of pregnancy: Secondary | ICD-10-CM

## 2022-07-03 DIAGNOSIS — O26899 Other specified pregnancy related conditions, unspecified trimester: Secondary | ICD-10-CM

## 2022-07-03 DIAGNOSIS — O9081 Anemia of the puerperium: Secondary | ICD-10-CM | POA: Diagnosis not present

## 2022-07-03 DIAGNOSIS — Z6791 Unspecified blood type, Rh negative: Secondary | ICD-10-CM | POA: Diagnosis not present

## 2022-07-03 DIAGNOSIS — Z23 Encounter for immunization: Secondary | ICD-10-CM

## 2022-07-03 DIAGNOSIS — O139 Gestational [pregnancy-induced] hypertension without significant proteinuria, unspecified trimester: Secondary | ICD-10-CM | POA: Diagnosis present

## 2022-07-03 DIAGNOSIS — O322XX0 Maternal care for transverse and oblique lie, not applicable or unspecified: Secondary | ICD-10-CM | POA: Diagnosis present

## 2022-07-03 DIAGNOSIS — Z7982 Long term (current) use of aspirin: Secondary | ICD-10-CM | POA: Diagnosis not present

## 2022-07-03 DIAGNOSIS — Z3400 Encounter for supervision of normal first pregnancy, unspecified trimester: Secondary | ICD-10-CM

## 2022-07-03 DIAGNOSIS — O26893 Other specified pregnancy related conditions, third trimester: Secondary | ICD-10-CM | POA: Diagnosis present

## 2022-07-03 DIAGNOSIS — D62 Acute posthemorrhagic anemia: Secondary | ICD-10-CM | POA: Diagnosis not present

## 2022-07-03 DIAGNOSIS — O9903 Anemia complicating the puerperium: Secondary | ICD-10-CM | POA: Diagnosis not present

## 2022-07-03 DIAGNOSIS — O99214 Obesity complicating childbirth: Secondary | ICD-10-CM | POA: Diagnosis present

## 2022-07-03 DIAGNOSIS — O99824 Streptococcus B carrier state complicating childbirth: Secondary | ICD-10-CM | POA: Diagnosis present

## 2022-07-03 DIAGNOSIS — O24410 Gestational diabetes mellitus in pregnancy, diet controlled: Secondary | ICD-10-CM | POA: Diagnosis not present

## 2022-07-03 DIAGNOSIS — O134 Gestational [pregnancy-induced] hypertension without significant proteinuria, complicating childbirth: Secondary | ICD-10-CM | POA: Diagnosis present

## 2022-07-03 DIAGNOSIS — O24420 Gestational diabetes mellitus in childbirth, diet controlled: Secondary | ICD-10-CM | POA: Diagnosis present

## 2022-07-03 LAB — CBC
HCT: 29.3 % — ABNORMAL LOW (ref 36.0–46.0)
Hemoglobin: 9.7 g/dL — ABNORMAL LOW (ref 12.0–15.0)
MCH: 31.8 pg (ref 26.0–34.0)
MCHC: 33.1 g/dL (ref 30.0–36.0)
MCV: 96.1 fL (ref 80.0–100.0)
Platelets: 156 10*3/uL (ref 150–400)
RBC: 3.05 MIL/uL — ABNORMAL LOW (ref 3.87–5.11)
RDW: 15.3 % (ref 11.5–15.5)
WBC: 8.8 10*3/uL (ref 4.0–10.5)
nRBC: 0 % (ref 0.0–0.2)

## 2022-07-03 LAB — COMPREHENSIVE METABOLIC PANEL
ALT: 20 U/L (ref 0–44)
AST: 26 U/L (ref 15–41)
Albumin: 2.7 g/dL — ABNORMAL LOW (ref 3.5–5.0)
Alkaline Phosphatase: 134 U/L — ABNORMAL HIGH (ref 38–126)
Anion gap: 7 (ref 5–15)
BUN: 7 mg/dL (ref 6–20)
CO2: 20 mmol/L — ABNORMAL LOW (ref 22–32)
Calcium: 8.5 mg/dL — ABNORMAL LOW (ref 8.9–10.3)
Chloride: 111 mmol/L (ref 98–111)
Creatinine, Ser: 0.59 mg/dL (ref 0.44–1.00)
GFR, Estimated: >60 mL/min (ref >60)
Glucose, Bld: 90 mg/dL (ref 70–99)
Potassium: 3.7 mmol/L (ref 3.5–5.1)
Sodium: 138 mmol/L (ref 135–145)
Total Bilirubin: 0.8 mg/dL (ref 0.3–1.2)
Total Protein: 5.6 g/dL — ABNORMAL LOW (ref 6.5–8.1)

## 2022-07-03 LAB — PROTEIN / CREATININE RATIO, URINE
Creatinine, Urine: 262 mg/dL
Protein Creatinine Ratio: 0.26 mg/mg{Cre} — ABNORMAL HIGH (ref 0.00–0.15)
Total Protein, Urine: 67 mg/dL

## 2022-07-03 LAB — RPR: RPR Ser Ql: NONREACTIVE

## 2022-07-03 SURGERY — Surgical Case
Anesthesia: Spinal

## 2022-07-03 MED ORDER — NALOXONE HCL 0.4 MG/ML IJ SOLN: 0.4000 mg | INTRAMUSCULAR | Status: DC | PRN

## 2022-07-03 MED ORDER — DEXAMETHASONE SODIUM PHOSPHATE 10 MG/ML IJ SOLN
INTRAMUSCULAR | Status: DC | PRN
  Administered 2022-07-03: 10 mg via INTRAVENOUS

## 2022-07-03 MED ORDER — OXYCODONE-ACETAMINOPHEN 5-325 MG PO TABS: 1.0000 | ORAL_TABLET | ORAL | Status: DC | PRN

## 2022-07-03 MED ORDER — LACTATED RINGERS IV SOLN: INTRAVENOUS | Status: DC

## 2022-07-03 MED ORDER — SENNOSIDES-DOCUSATE SODIUM 8.6-50 MG PO TABS
2.0000 | ORAL_TABLET | Freq: Every day | ORAL | Status: DC
  Administered 2022-07-03 – 2022-07-06 (×4): 2 via ORAL
  Filled 2022-07-03 (×4): qty 2

## 2022-07-03 MED ORDER — SIMETHICONE 80 MG PO CHEW
80.0000 mg | CHEWABLE_TABLET | Freq: Four times a day (QID) | ORAL | Status: DC
  Administered 2022-07-03 – 2022-07-06 (×11): 80 mg via ORAL
  Filled 2022-07-03 (×12): qty 1

## 2022-07-03 MED ORDER — OXYTOCIN-SODIUM CHLORIDE 30-0.9 UT/500ML-% IV SOLN: 2.5000 [IU]/h | INTRAVENOUS | Status: DC

## 2022-07-03 MED ORDER — DIPHENHYDRAMINE HCL 25 MG PO CAPS: 25.0000 mg | ORAL_CAPSULE | ORAL | Status: DC | PRN

## 2022-07-03 MED ORDER — LIDOCAINE HCL (PF) 1 % IJ SOLN
INTRAMUSCULAR | Status: DC | PRN
  Administered 2022-07-03: 3 mL

## 2022-07-03 MED ORDER — BUPIVACAINE IN DEXTROSE 0.75-8.25 % IT SOLN
INTRATHECAL | Status: DC | PRN
  Administered 2022-07-03: 1.5 mL via INTRATHECAL

## 2022-07-03 MED ORDER — ONDANSETRON HCL 4 MG/2ML IJ SOLN: 4.0000 mg | Freq: Three times a day (TID) | INTRAMUSCULAR | Status: DC | PRN

## 2022-07-03 MED ORDER — POVIDONE-IODINE 10 % EX SWAB
2.0000 | Freq: Once | CUTANEOUS | Status: AC
  Administered 2022-07-03: 2 via TOPICAL

## 2022-07-03 MED ORDER — NALOXONE HCL 4 MG/10ML IJ SOLN
1.0000 ug/kg/h | INTRAVENOUS | Status: DC | PRN
  Filled 2022-07-03: qty 5

## 2022-07-03 MED ORDER — VENLAFAXINE HCL ER 37.5 MG PO CP24
75.0000 mg | ORAL_CAPSULE | Freq: Every day | ORAL | Status: DC
  Administered 2022-07-03 – 2022-07-05 (×3): 75 mg via ORAL
  Filled 2022-07-03 (×5): qty 2

## 2022-07-03 MED ORDER — GLYCOPYRROLATE 0.2 MG/ML IJ SOLN
INTRAMUSCULAR | Status: DC | PRN
  Administered 2022-07-03: .2 mg via INTRAVENOUS

## 2022-07-03 MED ORDER — KETOROLAC TROMETHAMINE 30 MG/ML IJ SOLN: 30.0000 mg | Freq: Four times a day (QID) | INTRAMUSCULAR | Status: DC | PRN

## 2022-07-03 MED ORDER — MEPERIDINE HCL 25 MG/ML IJ SOLN: 6.2500 mg | INTRAMUSCULAR | Status: DC | PRN

## 2022-07-03 MED ORDER — MENTHOL 3 MG MT LOZG: 1.0000 | LOZENGE | OROMUCOSAL | Status: DC | PRN

## 2022-07-03 MED ORDER — PRENATAL MULTIVITAMIN CH
1.0000 | ORAL_TABLET | Freq: Every day | ORAL | Status: DC
  Administered 2022-07-03 – 2022-07-06 (×4): 1 via ORAL
  Filled 2022-07-03 (×4): qty 1

## 2022-07-03 MED ORDER — CEFAZOLIN IN SODIUM CHLORIDE 3-0.9 GM/100ML-% IV SOLN
3.0000 g | INTRAVENOUS | Status: AC
  Administered 2022-07-03: 3 g via INTRAVENOUS
  Filled 2022-07-03 (×2): qty 100

## 2022-07-03 MED ORDER — SCOPOLAMINE 1 MG/3DAYS TD PT72
1.0000 | MEDICATED_PATCH | Freq: Once | TRANSDERMAL | Status: AC
  Administered 2022-07-03: 1.5 mg via TRANSDERMAL
  Filled 2022-07-03: qty 1

## 2022-07-03 MED ORDER — LIDOCAINE 5 % EX PTCH
1.0000 | MEDICATED_PATCH | CUTANEOUS | Status: DC
Start: 2022-07-03 — End: 2022-07-06
  Administered 2022-07-05 (×2): 1 via TRANSDERMAL
  Filled 2022-07-03 (×3): qty 1

## 2022-07-03 MED ORDER — SOD CITRATE-CITRIC ACID 500-334 MG/5ML PO SOLN: 30.0000 mL | Freq: Once | ORAL | Status: AC

## 2022-07-03 MED ORDER — PHENYLEPHRINE 80 MCG/ML (10ML) SYRINGE FOR IV PUSH (FOR BLOOD PRESSURE SUPPORT)
PREFILLED_SYRINGE | INTRAVENOUS | Status: DC | PRN
  Administered 2022-07-03 (×7): 80 ug via INTRAVENOUS

## 2022-07-03 MED ORDER — FENTANYL CITRATE (PF) 100 MCG/2ML IJ SOLN
INTRAMUSCULAR | Status: AC
  Filled 2022-07-03: qty 2

## 2022-07-03 MED ORDER — ONDANSETRON HCL 4 MG/2ML IJ SOLN
INTRAMUSCULAR | Status: DC | PRN
  Administered 2022-07-03: 4 mg via INTRAVENOUS

## 2022-07-03 MED ORDER — ZOLPIDEM TARTRATE 5 MG PO TABS: 5.0000 mg | ORAL_TABLET | Freq: Every evening | ORAL | Status: DC | PRN

## 2022-07-03 MED ORDER — INFLUENZA VAC SPLIT QUAD 0.5 ML IM SUSY
0.5000 mL | PREFILLED_SYRINGE | INTRAMUSCULAR | Status: AC
  Administered 2022-07-06: 0.5 mL via INTRAMUSCULAR
  Filled 2022-07-03: qty 0.5

## 2022-07-03 MED ORDER — DIPHENHYDRAMINE HCL 25 MG PO CAPS: 25.0000 mg | ORAL_CAPSULE | Freq: Four times a day (QID) | ORAL | Status: DC | PRN

## 2022-07-03 MED ORDER — DIPHENHYDRAMINE HCL 50 MG/ML IJ SOLN: 12.5000 mg | INTRAMUSCULAR | Status: DC | PRN

## 2022-07-03 MED ORDER — PHENYLEPHRINE HCL-NACL 20-0.9 MG/250ML-% IV SOLN
INTRAVENOUS | Status: AC
  Filled 2022-07-03: qty 250

## 2022-07-03 MED ORDER — KETOROLAC TROMETHAMINE 30 MG/ML IJ SOLN
INTRAMUSCULAR | Status: DC | PRN
  Administered 2022-07-03: 30 mg via INTRAVENOUS

## 2022-07-03 MED ORDER — OXYTOCIN-SODIUM CHLORIDE 30-0.9 UT/500ML-% IV SOLN
INTRAVENOUS | Status: DC | PRN
  Administered 2022-07-03: 500 mL via INTRAVENOUS

## 2022-07-03 MED ORDER — FENTANYL CITRATE (PF) 100 MCG/2ML IJ SOLN
INTRAMUSCULAR | Status: DC | PRN
  Administered 2022-07-03: 15 ug via INTRATHECAL

## 2022-07-03 MED ORDER — LIDOCAINE 5 % EX PTCH
MEDICATED_PATCH | CUTANEOUS | Status: AC
  Filled 2022-07-03: qty 1

## 2022-07-03 MED ORDER — SOD CITRATE-CITRIC ACID 500-334 MG/5ML PO SOLN
ORAL | Status: AC
  Administered 2022-07-03: 30 mL via ORAL
  Filled 2022-07-03: qty 15

## 2022-07-03 MED ORDER — OXYCODONE HCL 5 MG PO TABS: 5.0000 mg | ORAL_TABLET | Freq: Four times a day (QID) | ORAL | Status: DC | PRN

## 2022-07-03 MED ORDER — OXYTOCIN-SODIUM CHLORIDE 30-0.9 UT/500ML-% IV SOLN
INTRAVENOUS | Status: AC
  Filled 2022-07-03: qty 500

## 2022-07-03 MED ORDER — LIDOCAINE 5 % EX PTCH
MEDICATED_PATCH | CUTANEOUS | Status: DC | PRN
  Administered 2022-07-03: 1 via TRANSDERMAL

## 2022-07-03 MED ORDER — MORPHINE SULFATE (PF) 0.5 MG/ML IJ SOLN
INTRAMUSCULAR | Status: AC
  Filled 2022-07-03: qty 10

## 2022-07-03 MED ORDER — SODIUM CHLORIDE 0.9% FLUSH: 3.0000 mL | INTRAVENOUS | Status: DC | PRN

## 2022-07-03 MED ORDER — ACETAMINOPHEN 500 MG PO TABS
1000.0000 mg | ORAL_TABLET | Freq: Four times a day (QID) | ORAL | Status: AC
  Administered 2022-07-03 – 2022-07-04 (×4): 1000 mg via ORAL
  Filled 2022-07-03 (×4): qty 2

## 2022-07-03 MED ORDER — PHENYLEPHRINE HCL-NACL 20-0.9 MG/250ML-% IV SOLN
INTRAVENOUS | Status: DC | PRN
  Administered 2022-07-03: 40 ug/min via INTRAVENOUS

## 2022-07-03 MED ORDER — CHLORHEXIDINE GLUCONATE 0.12 % MT SOLN
15.0000 mL | Freq: Once | OROMUCOSAL | Status: AC
  Administered 2022-07-03: 15 mL via OROMUCOSAL
  Filled 2022-07-03: qty 15

## 2022-07-03 MED ORDER — IBUPROFEN 600 MG PO TABS
600.0000 mg | ORAL_TABLET | Freq: Four times a day (QID) | ORAL | Status: AC
  Administered 2022-07-03 – 2022-07-06 (×11): 600 mg via ORAL
  Filled 2022-07-03 (×11): qty 1

## 2022-07-03 MED ORDER — MORPHINE SULFATE (PF) 0.5 MG/ML IJ SOLN
INTRAMUSCULAR | Status: DC | PRN
  Administered 2022-07-03: 100 ug via INTRATHECAL

## 2022-07-03 MED ORDER — ORAL CARE MOUTH RINSE: 15.0000 mL | Freq: Once | OROMUCOSAL | Status: AC

## 2022-07-03 SURGICAL SUPPLY — 28 items
ADHESIVE MASTISOL STRL (MISCELLANEOUS) ×1 IMPLANT
BAG COUNTER SPONGE SURGICOUNT (BAG) ×1 IMPLANT
BENZOIN TINCTURE PRP APPL 2/3 (GAUZE/BANDAGES/DRESSINGS) IMPLANT
CHLORAPREP W/TINT 26 (MISCELLANEOUS) ×2 IMPLANT
DRSG OPSITE POSTOP 4X10 (GAUZE/BANDAGES/DRESSINGS) IMPLANT
DRSG TELFA 3X8 NADH STRL (GAUZE/BANDAGES/DRESSINGS) ×1 IMPLANT
GAUZE SPONGE 4X4 12PLY STRL (GAUZE/BANDAGES/DRESSINGS) ×1 IMPLANT
GLOVE PI ORTHO PRO STRL 7.5 (GLOVE) ×1 IMPLANT
GOWN STRL REUS W/ TWL LRG LVL3 (GOWN DISPOSABLE) ×2 IMPLANT
GOWN STRL REUS W/TWL LRG LVL3 (GOWN DISPOSABLE) ×2
KIT TURNOVER KIT A (KITS) ×1 IMPLANT
MANIFOLD NEPTUNE II (INSTRUMENTS) ×1 IMPLANT
MAT PREVALON FULL STRYKER (MISCELLANEOUS) ×1 IMPLANT
NS IRRIG 1000ML POUR BTL (IV SOLUTION) ×1 IMPLANT
PACK C SECTION AR (MISCELLANEOUS) ×1 IMPLANT
PAD OB MATERNITY 4.3X12.25 (PERSONAL CARE ITEMS) ×1 IMPLANT
PAD PREP 24X41 OB/GYN DISP (PERSONAL CARE ITEMS) ×1 IMPLANT
RETRACTOR WND ALEXIS-O 25 LRG (MISCELLANEOUS) ×1 IMPLANT
RTRCTR WOUND ALEXIS O 25CM LRG (MISCELLANEOUS) ×1
SCRUB CHG 4% DYNA-HEX 4OZ (MISCELLANEOUS) ×1 IMPLANT
SPONGE T-LAP 18X18 ~~LOC~~+RFID (SPONGE) ×1 IMPLANT
STRIP CLOSURE SKIN 1/2X4 (GAUZE/BANDAGES/DRESSINGS) IMPLANT
SUT VIC AB 0 CTX 36 (SUTURE) ×2
SUT VIC AB 0 CTX36XBRD ANBCTRL (SUTURE) ×2 IMPLANT
SUT VIC AB 1 CT1 36 (SUTURE) ×2 IMPLANT
SUT VICRYL+ 3-0 36IN CT-1 (SUTURE) ×2 IMPLANT
TRAP FLUID SMOKE EVACUATOR (MISCELLANEOUS) ×1 IMPLANT
WATER STERILE IRR 500ML POUR (IV SOLUTION) ×1 IMPLANT

## 2022-07-03 NOTE — Anesthesia Preprocedure Evaluation (Signed)
Anesthesia Evaluation  Patient identified by MRN, date of birth, ID band Patient awake    Reviewed: Allergy & Precautions, NPO status , Patient's Chart, lab work & pertinent test results  Airway Mallampati: III  TM Distance: >3 FB Neck ROM: full    Dental  (+) Dental Advidsory Given, Teeth Intact   Pulmonary neg pulmonary ROS, neg shortness of breath, neg sleep apnea, neg recent URI,    Pulmonary exam normal        Cardiovascular Exercise Tolerance: Good hypertension, (-) angina(-) Past MI and (-) CABG negative cardio ROS Normal cardiovascular exam     Neuro/Psych PSYCHIATRIC DISORDERS    GI/Hepatic negative GI ROS,   Endo/Other  diabetes  Renal/GU   negative genitourinary   Musculoskeletal   Abdominal   Peds  Hematology negative hematology ROS (+)   Anesthesia Other Findings Past Medical History: No date: Anemia 07/2019: BRCA negative     Comment:  MyRisk neg No date: Depression No date: Family history of breast cancer No date: Gestational diabetes No date: History of PCOS No date: Hypertension     Comment:  GESTATIONAL No date: IBS (irritable bowel syndrome) 07/2019: Increased risk of breast cancer     Comment:  IBIS=27.7%/riskscore=22.9% No date: PCOS (polycystic ovarian syndrome) No date: Vitamin B12 deficiency No date: Vitamin D deficiency  Past Surgical History: No date: TONSILLECTOMY AND ADENOIDECTOMY No date: WISDOM TOOTH EXTRACTION  BMI    Body Mass Index: 40.51 kg/m      Reproductive/Obstetrics (+) Pregnancy                             Anesthesia Physical Anesthesia Plan  ASA: 3  Anesthesia Plan: Spinal   Post-op Pain Management:    Induction:   PONV Risk Score and Plan: 3  Airway Management Planned: Natural Airway and Nasal Cannula  Additional Equipment:   Intra-op Plan:   Post-operative Plan:   Informed Consent: I have reviewed the patients  History and Physical, chart, labs and discussed the procedure including the risks, benefits and alternatives for the proposed anesthesia with the patient or authorized representative who has indicated his/her understanding and acceptance.     Dental Advisory Given  Plan Discussed with: Anesthesiologist, CRNA and Surgeon  Anesthesia Plan Comments: (Patient reports no bleeding problems and no anticoagulant use.  Plan for spinal with backup GA  Patient consented for risks of anesthesia including but not limited to:  - adverse reactions to medications - damage to eyes, teeth, lips or other oral mucosa - nerve damage due to positioning  - risk of bleeding, infection and or nerve damage from spinal that could lead to paralysis - risk of headache or failed spinal - damage to teeth, lips or other oral mucosa - sore throat or hoarseness - damage to heart, brain, nerves, lungs, other parts of body or loss of life  Patient voiced understanding.)        Anesthesia Quick Evaluation

## 2022-07-03 NOTE — Transfer of Care (Signed)
Immediate Anesthesia Transfer of Care Note  Patient: Audrey Clarke  Procedure(s) Performed: CESAREAN SECTION  Patient Location: Mother/Baby  Anesthesia Type:Spinal  Level of Consciousness: awake, alert  and oriented  Airway & Oxygen Therapy: Patient Spontanous Breathing  Post-op Assessment: Report given to RN and Post -op Vital signs reviewed and stable  Post vital signs: Reviewed and stable  Last Vitals:  Vitals Value Taken Time  BP 137/91 07/03/22 1325  Temp 36.5 C 07/03/22 1325  Pulse 87 07/03/22 1325  Resp 17 07/03/22 1325  SpO2 100     Last Pain:  Vitals:   07/03/22 1059  TempSrc: Oral  PainSc:          Complications: No notable events documented.

## 2022-07-03 NOTE — Op Note (Addendum)
      OP NOTE  Date: 07/03/2022   4:22 PM Name Audrey Clarke MR# [id number]  Preoperative Diagnosis: 1. Intrauterine pregnancy at [redacted]w[redacted]d Principal Problem:   Gestational hypertension  2.  37 wk IUP - Transverse Lie  Postoperative Diagnosis: 1. Intrauterine pregnancy at [redacted]w[redacted]d, delivered 2. Viable infant 3. Remainder same as pre-op   Procedure: 1. Primary Low-Transverse Cesarean Section  Surgeon: Elonda Husky, MD  Assistant:  Valentino Saxon MD   No other capable assistant was available for this surgery which requires an experienced, high level assistant.   Anesthesia: Spinal    EBL: 750  ml     Findings: 1) female infant, Apgar scores of 8   at 1 minute and 8   at 5 minutes and a birthweight of 144.97  ounces.    2) Normal uterus, tubes and ovaries.    Procedure:  The patient was prepped and draped in the supine position and placed under spinal anesthesia.  A transverse incision was made across the abdomen in a Pfannenstiel manner. If indicated the old scar was systematically removed with sharp dissection.  We carried the dissection down to the level of the fascia.  The fascia was incised in a curvilinear manner.  The fascia was then elevated from the rectus muscles with blunt and sharp dissection.  The rectus muscles were separated laterally exposing the peritoneum.  The peritoneum was carefully entered with care being taken to avoid bowel and bladder.  A self-retaining retractor was placed.  The visceral peritoneum was incised in a curvilinear fashion across the lower uterine segment creating a bladder flap. A transverse incision was made across the lower uterine segment and extended laterally and superiorly using the bandage scissors.  Artificial rupture membranes was performed and copious clear fluid was noted.  The infant was delivered from the transverse position, delivering the  feet first.  A nuchal cord was present. After an appropriate time interval, the cord was  doubly clamped and cut. Cord blood was obtained if required.  The infant was handed to the pediatric personnel  who then placed the infant under heat lamps where it was cleaned dried and suctioned as needed. The placenta was delivered. The hysterotomy incision was then identified on ring forceps.  The uterine cavity was cleaned with a moist lap sponge.  The hysterotomy incision was closed with a running interlocking suture of Vicryl.  Hemostasis was excellent.  Pitocin was run in the IV and the uterus was found to be firm. The posterior cul-de-sac and gutters were cleaned and inspected.  Hemostasis was noted.  The fascia was then closed with a running suture of #1 Vicryl.  Hemostasis of the subcutaneous tissues was obtained using the Bovie.  The subcutaneous tissues were closed with a running suture of 000 Vicryl.  A subcuticular suture was placed.  Steri-strips were applied.   A pressure dressing was placed.  The patient went to the recovery room in stable condition. Dr. Valentino Saxon provided exposure, dissection, suctioning, retraction, and general support and assistance during the procedure.   Elonda Husky, M.D. 07/03/2022 4:22 PM

## 2022-07-03 NOTE — Anesthesia Procedure Notes (Addendum)
Spinal  Patient location during procedure: OR Start time: 07/03/2022 12:15 PM End time: 07/03/2022 12:18 PM Reason for block: surgical anesthesia Staffing Performed: resident/CRNA  Anesthesiologist: Dimas Millin, MD Resident/CRNA: Norm Salt, CRNA Performed by: Lily Peer, Hamad Whyte, CRNA Authorized by: Dimas Millin, MD   Preanesthetic Checklist Completed: patient identified, IV checked, site marked, risks and benefits discussed, surgical consent, monitors and equipment checked, pre-op evaluation and timeout performed Spinal Block Patient position: sitting Prep: ChloraPrep Patient monitoring: heart rate, continuous pulse ox and blood pressure Approach: midline Location: L3-4 Injection technique: single-shot Needle Needle type: Pencan  Needle gauge: 24 G Needle length: 10 cm Assessment Sensory level: T4 Events: CSF return Additional Notes IV functioning, monitors applied to pt. Expiration date of kit checked and confirmed to be in date. Sterile prep and drape, hand hygiene and sterile gloved used. Pt was positioned and spine was prepped in sterile fashion. Skin was anesthetized with lidocaine. Free flow of clear CSF obtained prior to injecting local anesthetic into CSF x 1 attempt. Spinal needle aspirated freely following injection. Needle was carefully withdrawn, and pt tolerated procedure well. Loss of motor and sensory on exam post injection.

## 2022-07-04 ENCOUNTER — Encounter: Payer: Self-pay | Admitting: Obstetrics and Gynecology

## 2022-07-04 DIAGNOSIS — O322XX0 Maternal care for transverse and oblique lie, not applicable or unspecified: Secondary | ICD-10-CM

## 2022-07-04 DIAGNOSIS — D62 Acute posthemorrhagic anemia: Secondary | ICD-10-CM

## 2022-07-04 DIAGNOSIS — O24410 Gestational diabetes mellitus in pregnancy, diet controlled: Secondary | ICD-10-CM

## 2022-07-04 DIAGNOSIS — Z3A37 37 weeks gestation of pregnancy: Secondary | ICD-10-CM

## 2022-07-04 DIAGNOSIS — O9903 Anemia complicating the puerperium: Secondary | ICD-10-CM

## 2022-07-04 DIAGNOSIS — O134 Gestational [pregnancy-induced] hypertension without significant proteinuria, complicating childbirth: Secondary | ICD-10-CM

## 2022-07-04 LAB — CBC
HCT: 22.4 % — ABNORMAL LOW (ref 36.0–46.0)
Hemoglobin: 7.5 g/dL — ABNORMAL LOW (ref 12.0–15.0)
MCH: 32.3 pg (ref 26.0–34.0)
MCHC: 33.5 g/dL (ref 30.0–36.0)
MCV: 96.6 fL (ref 80.0–100.0)
Platelets: 152 10*3/uL (ref 150–400)
RBC: 2.32 MIL/uL — ABNORMAL LOW (ref 3.87–5.11)
RDW: 15.4 % (ref 11.5–15.5)
WBC: 11.8 10*3/uL — ABNORMAL HIGH (ref 4.0–10.5)
nRBC: 0 % (ref 0.0–0.2)

## 2022-07-04 LAB — FETAL SCREEN: Fetal Screen: NEGATIVE

## 2022-07-04 MED ORDER — RHO D IMMUNE GLOBULIN 1500 UNIT/2ML IJ SOSY
300.0000 ug | PREFILLED_SYRINGE | Freq: Once | INTRAMUSCULAR | Status: AC
  Administered 2022-07-04: 300 ug via INTRAVENOUS
  Filled 2022-07-04: qty 2

## 2022-07-04 NOTE — Anesthesia Post-op Follow-up Note (Signed)
  Anesthesia Pain Follow-up Note  Patient: Audrey Clarke  Day #: 1  Date of Follow-up: 07/04/2022 Time: 7:34 AM  Last Vitals:  Vitals:   07/03/22 2350 07/04/22 0303  BP: (!) 126/92 130/79  Pulse: 84 87  Resp: 18 20  Temp: 36.9 C 36.8 C  SpO2: 99% 99%    Level of Consciousness: alert  Pain: none   Side Effects:None  Catheter Site Exam:clean, dry, no drainage     Plan: D/C from anesthesia care at surgeon's request  Elmarie Mainland

## 2022-07-04 NOTE — Anesthesia Postprocedure Evaluation (Signed)
Anesthesia Post Note  Patient: Audrey Clarke  Procedure(s) Performed: CESAREAN SECTION  Patient location during evaluation: Mother Baby Anesthesia Type: Spinal Level of consciousness: oriented and awake and alert Pain management: pain level controlled Vital Signs Assessment: post-procedure vital signs reviewed and stable Respiratory status: spontaneous breathing and respiratory function stable Cardiovascular status: blood pressure returned to baseline and stable Postop Assessment: no headache, no backache, no apparent nausea or vomiting and able to ambulate Anesthetic complications: no   No notable events documented.   Last Vitals:  Vitals:   07/03/22 2350 07/04/22 0303  BP: (!) 126/92 130/79  Pulse: 84 87  Resp: 18 20  Temp: 36.9 C 36.8 C  SpO2: 99% 99%    Last Pain:  Vitals:   07/04/22 0305  TempSrc:   PainSc: 0-No pain                 Refael Fulop B [name]

## 2022-07-04 NOTE — H&P (Signed)
History and Physical   HPI  Audrey Clarke is a 31 y.o. G1P0000 at 30w4dEstimated Date of Delivery: 07/21/22 who is being admitted for C-section for gestational hypertension and transverse fetal lie.   OB History  OB History  Gravida Para Term Preterm AB Living  1 0 0 0 0 0  SAB IAB Ectopic Multiple Live Births  0 0 0 0 0    # Outcome Date GA Lbr Len/2nd Weight Sex Delivery Anes PTL Lv  1 Current             PROBLEM LIST  Pregnancy complications or risks: Patient Active Problem List   Diagnosis Date Noted   [redacted] weeks gestation of pregnancy    Labor and delivery, indication for care 06/25/2022   Headache in pregnancy 06/18/2022   Gestational hypertension 06/18/2022   Transverse lie of fetus 06/18/2022   GBS (group B Streptococcus carrier), +RV culture, currently pregnant 06/17/2022   Elevated blood pressure affecting pregnancy in third trimester, antepartum 06/14/2022   GDM (gestational diabetes mellitus) 05/01/2022   Abnormal glucose tolerance in pregnancy 04/24/2022   Rh negative state in antepartum period 03/20/2022   Family history of breast cancer 03/11/2022   Obesity affecting pregnancy 12/27/2021   Supervision of normal first pregnancy, antepartum 12/12/2021   Anxiety with depression 10/25/2019   Chronic migraine without aura without status migrainosus, not intractable 10/25/2019   Irritable bowel syndrome with constipation 10/25/2019   Psoriasis 12/23/2017    Prenatal labs and studies: ABO, Rh: --/--/A NEG (09/05 1054) Antibody: POS (09/05 1054) Rubella: 1.59 (02/15 1541) RPR: NON REACTIVE (09/05 1054)  HBsAg: Negative (02/15 1541)  HIV: Non Reactive (06/26 0933)  GBS:    Past Medical History:  Diagnosis Date   Anemia    BRCA negative 07/2019   MyRisk neg   Depression    Family history of breast cancer    Gestational diabetes    History of PCOS    Hypertension    GESTATIONAL   IBS (irritable bowel syndrome)    Increased risk of  breast cancer 07/2019   IBIS=27.7%/riskscore=22.9%   PCOS (polycystic ovarian syndrome)    Vitamin B12 deficiency    Vitamin D deficiency      Past Surgical History:  Procedure Laterality Date   CESAREAN SECTION N/A 07/03/2022   Procedure: CESAREAN SECTION;  Surgeon: EHarlin Heys MD;  Location: [facility] ORS;  Service: Obstetrics;  Laterality: N/A;   TONSILLECTOMY AND ADENOIDECTOMY     WISDOM TOOTH EXTRACTION       Medications    Current Discharge Medication List     CONTINUE these medications which have NOT CHANGED   Details  magnesium oxide (MAG-OX) 400 (240 Mg) MG tablet Take 400 mg by mouth daily.    Prenatal Vit-Fe Fumarate-FA (MULTIVITAMIN-PRENATAL) 27-0.8 MG TABS tablet Take 1 tablet by mouth daily at 12 noon.    Riboflavin 100 MG CAPS Take 400 mg by mouth at bedtime.    Accu-Chek Softclix Lancets lancets Use as instructed Qty: 100 each, Refills: 12   Associated Diagnoses: Diet controlled gestational diabetes mellitus (GDM) in third trimester    aspirin EC 81 MG tablet Take 81 mg by mouth daily.    Blood Glucose Monitoring Suppl (ACCU-CHEK AVIVA PLUS) w/Device KIT Check blood sugar 4 times daily; fasting and 2 hours after each meal Qty: 1 kit, Refills: 0   Associated Diagnoses: Diet controlled gestational diabetes mellitus (GDM) in third trimester    cetirizine (ZYRTEC)  10 MG tablet Take 10 mg by mouth daily.    co-enzyme Q-10 30 MG capsule Take 150 mg by mouth daily.    famotidine (PEPCID) 20 MG tablet Take 20 mg by mouth 2 (two) times daily.    glucose blood (ACCU-CHEK AVIVA PLUS) test strip Use as instructed Qty: 100 each, Refills: 12   Associated Diagnoses: Diet controlled gestational diabetes mellitus (GDM) in third trimester    venlafaxine (EFFEXOR) 75 MG tablet Take 75 mg by mouth daily.         Allergies  Sertraline and Augmentin [amoxicillin-pot clavulanate]  Review of Systems  Pertinent items noted in HPI and remainder of comprehensive  ROS otherwise negative.  Physical Exam  BP 122/80 (BP Location: Left Arm)   Pulse 92   Temp 98.1 F (36.7 C) (Oral)   Resp 20   Ht 5' 6" (1.676 m)   Wt 113.9 kg   LMP 10/14/2021   SpO2 98%   BMI 40.51 kg/m   Lungs:  CTA B Cardio: RRR without M/R/G Abd: Soft, gravid, NT Presentation: transverse EXT: No C/C/ 1+ Edema DTRs: 2+ B CERVIX:    See Prenatal records for more detailed PE.     FHR:  Variability: Good {> 6 bpm)  Test Results  Results for orders placed or performed during the hospital encounter of 07/03/22 (from the past 24 hour(s))  Protein / creatinine ratio, urine     Status: Abnormal   Collection Time: 07/03/22 10:40 AM  Result Value Ref Range   Creatinine, Urine 262 mg/dL   Total Protein, Urine 67 mg/dL   Protein Creatinine Ratio 0.26 (H) 0.00 - 0.15 mg/mg[Cre]  CBC     Status: Abnormal   Collection Time: 07/03/22 11:39 AM  Result Value Ref Range   WBC 8.8 4.0 - 10.5 K/uL   RBC 3.05 (L) 3.87 - 5.11 MIL/uL   Hemoglobin 9.7 (L) 12.0 - 15.0 g/dL   HCT 29.3 (L) 36.0 - 46.0 %   MCV 96.1 80.0 - 100.0 fL   MCH 31.8 26.0 - 34.0 pg   MCHC 33.1 30.0 - 36.0 g/dL   RDW 15.3 11.5 - 15.5 %   Platelets 156 150 - 400 K/uL   nRBC 0.0 0.0 - 0.2 %  Comprehensive metabolic panel     Status: Abnormal   Collection Time: 07/03/22 11:39 AM  Result Value Ref Range   Sodium 138 135 - 145 mmol/L   Potassium 3.7 3.5 - 5.1 mmol/L   Chloride 111 98 - 111 mmol/L   CO2 20 (L) 22 - 32 mmol/L   Glucose, Bld 90 70 - 99 mg/dL   BUN 7 6 - 20 mg/dL   Creatinine, Ser 0.59 0.44 - 1.00 mg/dL   Calcium 8.5 (L) 8.9 - 10.3 mg/dL   Total Protein 5.6 (L) 6.5 - 8.1 g/dL   Albumin 2.7 (L) 3.5 - 5.0 g/dL   AST 26 15 - 41 U/L   ALT 20 0 - 44 U/L   Alkaline Phosphatase 134 (H) 38 - 126 U/L   Total Bilirubin 0.8 0.3 - 1.2 mg/dL   GFR, Estimated >60 >60 mL/min   Anion gap 7 5 - 15  CBC     Status: Abnormal   Collection Time: 07/04/22  7:03 AM  Result Value Ref Range   WBC 11.8 (H) 4.0  - 10.5 K/uL   RBC 2.32 (L) 3.87 - 5.11 MIL/uL   Hemoglobin 7.5 (L) 12.0 - 15.0 g/dL   HCT 22.4 (L)  36.0 - 46.0 %   MCV 96.6 80.0 - 100.0 fL   MCH 32.3 26.0 - 34.0 pg   MCHC 33.5 30.0 - 36.0 g/dL   RDW 15.4 11.5 - 15.5 %   Platelets 152 150 - 400 K/uL   nRBC 0.0 0.0 - 0.2 %     Assessment   G1P0000 at 50w4dEstimated Date of Delivery: 07/21/22  The fetus is reassuring.  317-weekIUP with mom having gestational hypertension Transverse fetal lie -declined ECV  Patient Active Problem List   Diagnosis Date Noted   [redacted] weeks gestation of pregnancy    Labor and delivery, indication for care 06/25/2022   Headache in pregnancy 06/18/2022   Gestational hypertension 06/18/2022   Transverse lie of fetus 06/18/2022   GBS (group B Streptococcus carrier), +RV culture, currently pregnant 06/17/2022   Elevated blood pressure affecting pregnancy in third trimester, antepartum 06/14/2022   GDM (gestational diabetes mellitus) 05/01/2022   Abnormal glucose tolerance in pregnancy 04/24/2022   Rh negative state in antepartum period 03/20/2022   Family history of breast cancer 03/11/2022   Obesity affecting pregnancy 12/27/2021   Supervision of normal first pregnancy, antepartum 12/12/2021   Anxiety with depression 10/25/2019   Chronic migraine without aura without status migrainosus, not intractable 10/25/2019   Irritable bowel syndrome with constipation 10/25/2019   Psoriasis 12/23/2017    Plan  1. Admit to L&D :   2. EFM: -- Category 1 3.  Cesarean delivery   DFinis Bud M.D. 07/04/2022 10:03 AM

## 2022-07-04 NOTE — Progress Notes (Signed)
Patient ID: Audrey Clarke, female   DOB: Jan 31, 1991, 31 y.o.   MRN: [id number]    Progress Note - Cesarean Delivery  Audrey Clarke is a 31 y.o. G1P0000 now PP day 1 s/p C-Section, Low Transverse .   Subjective:  Patient reports no problems with eating, bowel movements, voiding, or their wound  She reports she has been out of bed several times without lightheadedness.  Her pain is controlled.  Objective:  Vital signs in last 24 hours: Temp:  [97.7 F (36.5 C)-98.5 F (36.9 C)] 98.1 F (36.7 C) (09/07 0821) Pulse Rate:  [70-109] 92 (09/07 0821) Resp:  [11-23] 20 (09/07 0821) BP: (122-153)/(79-132) 122/80 (09/07 0821) SpO2:  [97 %-100 %] 98 % (09/07 0821) Weight:  [113.9 kg] 113.9 kg (09/06 1042)  Physical Exam:  General: alert, cooperative, and no distress Lochia: appropriate Uterine Fundus: firm Incision: Dressing intact    Data Review Recent Labs    07/03/22 1139 07/04/22 0703  HGB 9.7* 7.5*  HCT 29.3* 22.4*    Assessment:  Principal Problem:   Gestational hypertension   Status post Cesarean section. Doing well postoperatively.   Gestational hypertension currently doing well without medication.  Anemia -patient without symptoms  Plan:       Continue current care.  Recheck CBC in a.m.  Consider packed cells if patient becomes symptomatic or if count warrants.  Continue to follow blood pressures to consider medication prior to discharge if they become elevated.  Elonda Husky, M.D. 07/04/2022 8:22 AM

## 2022-07-04 NOTE — Lactation Note (Signed)
This note was copied from a baby's chart. Lactation Consultation Note  Patient Name: Girl Dannae Kato IFOYD'X Date: 07/04/2022 Reason for consult: Initial assessment;Primapara;Early term 37-38.6wks;Other (Comment) (Baby in SCN) Age:31 years  Maternal Data\ This is mom's first baby, primary C/S. Mom's plan is to breastfeed her baby. Mom has been pumping and collecting colostrum prior to delivery. Mom with history of PCOS, anemia, depression, hypertension. Baby is in SCN r/t respiratory distress. Mom is pumping. Met with mom today, she had just completed a pumping session. Mom is expressing measurable amounts of colostrum.  Does the patient have breastfeeding experience prior to this delivery?: No  Feeding Mother's Current Feeding Choice: Breast Milk   Lactation Tools Discussed/Used Breast pump type: Double-Electric Breast Pump Pump Education: Setup, frequency, and cleaning;Milk Storage Reason for Pumping: Baby in SCN Pumping frequency: 8 times/24 hours Pumped volume:  (Mom is obtaining measurable amounts of colostrum 3-5 ml's.)  Interventions Interventions: Education;DEBP, recommended to goal for 8 pump sessions/24 hours.  Discharge Pump: Personal  Consult Status Consult Status: Follow-up  Update provided to care nurse.  Jonna Waylon Koffler 07/04/2022, 5:31 PM

## 2022-07-05 LAB — CBC
HCT: 21.3 % — ABNORMAL LOW (ref 36.0–46.0)
Hemoglobin: 7 g/dL — ABNORMAL LOW (ref 12.0–15.0)
MCH: 32.6 pg (ref 26.0–34.0)
MCHC: 32.9 g/dL (ref 30.0–36.0)
MCV: 99.1 fL (ref 80.0–100.0)
Platelets: 145 10*3/uL — ABNORMAL LOW (ref 150–400)
RBC: 2.15 MIL/uL — ABNORMAL LOW (ref 3.87–5.11)
RDW: 15.8 % — ABNORMAL HIGH (ref 11.5–15.5)
WBC: 9.1 10*3/uL (ref 4.0–10.5)
nRBC: 0 % (ref 0.0–0.2)

## 2022-07-05 LAB — RHOGAM INJECTION: Unit division: 0

## 2022-07-05 MED ORDER — LABETALOL HCL 200 MG PO TABS
200.0000 mg | ORAL_TABLET | Freq: Two times a day (BID) | ORAL | Status: DC
  Administered 2022-07-05 (×2): 200 mg via ORAL
  Filled 2022-07-05 (×2): qty 1

## 2022-07-05 MED ORDER — SODIUM CHLORIDE 0.9 % IV SOLN
200.0000 mg | Freq: Once | INTRAVENOUS | Status: AC
  Administered 2022-07-05: 200 mg via INTRAVENOUS
  Filled 2022-07-05: qty 200

## 2022-07-05 NOTE — Lactation Note (Addendum)
This note was copied from a baby's chart. Lactation Consultation Note  Patient Name: Audrey Clarke ZYSAY'T Date: 07/05/2022 Reason for consult: Follow-up assessment;Primapara;Early term 37-38.6wks Age:31 hours  Maternal Data Has patient been taught Hand Expression?: Yes Mom is continuing to pump breasts q 3h or after feeding baby at breast  Feeding Mother's Current Feeding Choice: Breast Milk Observed baby at breast in SCN, baby at left breast in football hold, rooting at breast, latches well with shaping of tissue, not opening wide at first, pulls in upper lip, mom does not report any nipple pain and states she feels tugging, shown how to flange upper lip and use chin pressure to widen latch, baby nursing well with swallows noted, needing occ stimulation to continue to nurse    LATCH Score Latch: Grasps breast easily, tongue down, lips flanged, rhythmical sucking.  Audible Swallowing: Spontaneous and intermittent  Type of Nipple: Everted at rest and after stimulation  Comfort (Breast/Nipple): Soft / non-tender  Hold (Positioning): Assistance needed to correctly position infant at breast and maintain latch.  LATCH Score: 9   Lactation Tools Discussed/Used Tools: Pump Breast pump type: Double-Electric Breast Pump Reason for Pumping: baby in SCN Pumping frequency: q3 h Pumped volume: 10 mL LC name and no written on white board in mom's room Interventions Interventions: Assisted with latch;Support pillows;Hand express;Education;DEBP  Discharge Pump: DEBP;Personal WIC Program: No  Consult Status Consult Status: PRN Date: 07/05/22 Follow-up type: In-patient    Audrey Clarke 07/05/2022, 2:36 PM

## 2022-07-05 NOTE — Progress Notes (Signed)
Obstetric Postpartum Daily Progress Note Subjective:  31 y.o. G1P0000 postpartum day #2 status post primary LTCS.  She is ambulating, is tolerating po, is voiding spontaneously.  Her pain is well controlled on PO pain medications. Her lochia is less than menses. Discussed that she has had an elevated BP this morning and has had a few elevated BP's just after birth.  Current recommendations are to start BP lowering medications and  to keep blood pressures below 140/90.  (Although ACOG does not recommend treatment until BP's are over 150/100). The purpose of mediation is to keep blood pressures at a more normal range and reduced her chances of a hospital readmission for preeclampsia or severe range blood pressures. She has had some blood pressures WNL She was seen by Dr Logan Bores this morning, who felt BP medication was not warranted. Marsi prefers to "play it safe" and start Blood pressure lower medication now.   Medications SCHEDULED MEDICATIONS   ibuprofen  600 mg Oral Q6H   influenza vac split quadrivalent PF  0.5 mL Intramuscular Tomorrow-1000   lidocaine  1 patch Transdermal Q24H   prenatal multivitamin  1 tablet Oral Q1200   scopolamine  1 patch Transdermal Once   senna-docusate  2 tablet Oral Daily   simethicone  80 mg Oral QID   venlafaxine XR  75 mg Oral Daily    MEDICATION INFUSIONS    PRN MEDICATIONS  diphenhydrAMINE **OR** diphenhydrAMINE, menthol-cetylpyridinium, ondansetron (ZOFRAN) IV, oxyCODONE, oxyCODONE-acetaminophen, zolpidem    Objective:   Vitals:   07/04/22 1917 07/04/22 2305 07/05/22 0306 07/05/22 0753  BP: (!) 140/78 127/70 138/81 (!) 148/86  Pulse: 98 98 85 87  Resp: 20 18 18 18   Temp: 98.2 F (36.8 C) 98.2 F (36.8 C) (!) 97.5 F (36.4 C) 98.4 F (36.9 C)  TempSrc: Axillary Oral Oral Oral  SpO2: 100% 98% 100% 99%  Weight:      Height:        Current Vital Signs 24h Vital Sign Ranges  T 98.4 F (36.9 C) Temp  Avg: 98.1 F (36.7 C)  Min: 97.5 F (36.4  C)  Max: 98.4 F (36.9 C)  BP (!) 148/86 BP  Min: 127/70  Max: 148/86  HR 87 Pulse  Avg: 90.3  Min: 85  Max: 98  RR 18 Resp  Avg: 19  Min: 18  Max: 20  SaO2 99 % Room Air SpO2  Avg: 98.8 %  Min: 97 %  Max: 100 %       24 Hour I/O Current Shift I/O  Time Ins Outs 09/07 0701 - 09/08 0700 In: -  Out: 650 [Urine:650] No intake/output data recorded.  General: NAD Pulmonary: no increased work of breathing, CTAB  DTR's +1  Extremities: +2 edema, no erythema, no tenderness  Labs:  Recent Labs  Lab 07/03/22 1139 07/04/22 0703 07/05/22 0455  WBC 8.8 11.8* 9.1  HGB 9.7* 7.5* 7.0*  HCT 29.3* 22.4* 21.3*  PLT 156 152 145*     Assessment:   30 y.o. G1P0000 postpartum day # 2 status post PLTCS  Plan:   Reviewed plan with Dr 09/04/22  Will start Labetalol 200mg  BID   Valentino Saxon Skyeler Scalese, CNM  07/05/2022 11:38 AM

## 2022-07-05 NOTE — Progress Notes (Signed)
Progress Note - Cesarean Delivery  Audrey Clarke is a 31 y.o. G1P0000 now PP day 2 s/p C-Section, Low Transverse .   Subjective:  Patient reports no problems with eating, bowel movements, voiding, or their wound  Not lightheaded.  Objective:  Vital signs in last 24 hours: Temp:  [97.5 F (36.4 C)-98.4 F (36.9 C)] 98.4 F (36.9 C) (09/08 0753) Pulse Rate:  [85-98] 87 (09/08 0753) Resp:  [18-20] 18 (09/08 0753) BP: (127-148)/(70-86) 148/86 (09/08 0753) SpO2:  [97 %-100 %] 99 % (09/08 0753)  Physical Exam:  General: alert, cooperative, and no distress Lochia: appropriate Uterine Fundus: firm Incision: dressing intact    Data Review Recent Labs    07/04/22 0703 07/05/22 0455  HGB 7.5* 7.0*  HCT 22.4* 21.3*    Assessment:  Principal Problem:   Gestational hypertension   Status post Cesarean section. Doing well postoperatively.   Anemia - as pt is asymptomatic but has low Hgb will give iron infusion today.  Discussed all options with pt and she agrees.  HTN - pressures sometimes normal sometimes elevated.  No severe range!     Plan:       Continue current care.  Probable discharge in AM tomorrow  Iron infusion today - pt to be discharged on oral Iron too.  Follow BP's q4hours.  If majority are above 140/90 - then recommend discharge on Procarida XL 30mg  daily.  If not frequently above 140/90 then do not treat.   , M.D. 07/05/2022 11:18 AM

## 2022-07-06 ENCOUNTER — Other Ambulatory Visit: Payer: Self-pay | Admitting: Advanced Practice Midwife

## 2022-07-06 DIAGNOSIS — O165 Unspecified maternal hypertension, complicating the puerperium: Secondary | ICD-10-CM

## 2022-07-06 MED ORDER — OXYCODONE HCL 5 MG PO TABS: 5.0000 mg | ORAL_TABLET | Freq: Four times a day (QID) | ORAL | 0 refills | Status: AC | PRN

## 2022-07-06 MED ORDER — LABETALOL HCL 300 MG PO TABS
300.0000 mg | ORAL_TABLET | Freq: Two times a day (BID) | ORAL | 2 refills | Status: DC
Start: 2022-07-06 — End: 2022-07-06

## 2022-07-06 MED ORDER — LABETALOL HCL 200 MG PO TABS
300.0000 mg | ORAL_TABLET | Freq: Two times a day (BID) | ORAL | Status: DC
  Administered 2022-07-06: 300 mg via ORAL
  Filled 2022-07-06 (×2): qty 1

## 2022-07-06 MED ORDER — LABETALOL HCL 200 MG PO TABS: 300.0000 mg | ORAL_TABLET | Freq: Two times a day (BID) | ORAL | 2 refills | Status: DC

## 2022-07-06 MED ORDER — LABETALOL HCL 300 MG PO TABS: 300.0000 mg | ORAL_TABLET | Freq: Two times a day (BID) | ORAL | 2 refills | Status: DC

## 2022-07-06 NOTE — Progress Notes (Signed)
Obstetric Postpartum/PostOperative Daily Progress Note Subjective:  31 y.o. G1P0000 post-operative day # 3 status post primary cesarean delivery.  She is ambulating, is tolerating po, is voiding spontaneously.  Her pain is well controlled on PO pain medications. Her lochia is less than menses. She reports breastfeeding is going very well- great supply. She denies headache, visual changes, epigastric pain. Discussed increasing dose of labetalol and reassess for control of BP prior to discharge home. Patient agrees with plan.   Medications SCHEDULED MEDICATIONS   ibuprofen  600 mg Oral Q6H   labetalol  300 mg Oral BID   lidocaine  1 patch Transdermal Q24H   prenatal multivitamin  1 tablet Oral Q1200   scopolamine  1 patch Transdermal Once   senna-docusate  2 tablet Oral Daily   simethicone  80 mg Oral QID   venlafaxine XR  75 mg Oral Daily    MEDICATION INFUSIONS    PRN MEDICATIONS  diphenhydrAMINE **OR** diphenhydrAMINE, menthol-cetylpyridinium, ondansetron (ZOFRAN) IV, oxyCODONE, oxyCODONE-acetaminophen, zolpidem    Objective:   Vitals:   07/06/22 0015 07/06/22 0400 07/06/22 0815 07/06/22 0845  BP: 139/85 (!) 152/78 (!) 147/99 (!) 157/88  Pulse: 95 (!) 102 100 99  Resp: 17 18 18    Temp: 97.8 F (36.6 C) 98.3 F (36.8 C) 98.5 F (36.9 C)   TempSrc: Oral Oral Oral   SpO2: 98% 97% 99%   Weight:      Height:        Current Vital Signs 24h Vital Sign Ranges  T 98.5 F (36.9 C) Temp  Avg: 98.5 F (36.9 C)  Min: 97.8 F (36.6 C)  Max: 99.1 F (37.3 C)  BP (!) 157/88 (rechecked while still in room completing assessment) BP  Min: 133/82  Max: 157/88  HR 99 Pulse  Avg: 94.6  Min: 87  Max: 102  RR 18 Resp  Avg: 17.5  Min: 16  Max: 18  SaO2 99 %  (Room Air) SpO2  Avg: 98.5 %  Min: 97 %  Max: 99 %       24 Hour I/O Current Shift I/O  Time Ins Outs No intake/output data recorded. 09/09 0701 - 09/09 1900 In: 240 [P.O.:240] Out: -   General: NAD Pulmonary: no increased work of  breathing Abdomen: non-distended, non-tender, fundus firm at level of umbilicus Inc: Clean/dry/intact, small area of old sero/sang. drainage Extremities: no edema, no erythema, no tenderness  Labs:  Recent Labs  Lab 07/03/22 1139 07/04/22 0703 07/05/22 0455  WBC 8.8 11.8* 9.1  HGB 9.7* 7.5* 7.0*  HCT 29.3* 22.4* 21.3*  PLT 156 152 145*     Assessment:   31 y.o. G1P0000 postoperative day # 3 status post primary cesarean section, lactating  Plan:  1) Acute blood loss anemia - hemodynamically stable and asymptomatic - po ferrous sulfate  2) A NEG / Rubella 1.59 (02/15 1541)/ Varicella Immune  3) TDAP status : given antepartum, Flu vaccine given today  4) Breastfeeding  5) Contraception = oral progesterone-only contraceptive  6) gHTN: increase dose of Labetalol to 300 BID, assess for effectiveness  7) Disposition: continue current care with discharge likely later today   03-05-2001, CNM 07/06/2022 9:52 AM

## 2022-07-06 NOTE — Progress Notes (Signed)
Labetalol reordered to [facility] [address]. Rx not available at [facility].

## 2022-07-06 NOTE — Progress Notes (Signed)
Flu shot given at 0933 in R deltoid.   Flu shot would not scan and nurse failed to write down lot number before putting in sharps container.

## 2022-07-06 NOTE — Discharge Instructions (Addendum)
Discharge Instructions:   Follow-up Appointment: Call and schedule a follow-up appointment for a visit on Tuesday, 9/12 for a blood pressure check and incision check and a 6-week postpartum check with Dr. Logan Bores!  If there are any new medications, they have been ordered and will be available for pickup at the listed pharmacy on your way home from the hospital.   Call office if you have any of the following: headache, visual changes, fever >101.0 F, chills, shortness of breath, breast concerns, excessive vaginal bleeding, incision drainage or problems, leg pain or redness, depression or any other concerns. If you have vaginal discharge with an odor, let your doctor know.   It is normal to bleed for up to 6 weeks. You should not soak through more than 1 pad in 1 hour. If you have a blood clot larger than your fist with continued bleeding, call your doctor.   After a c-section, you should expect a small amount of blood or clear fluid coming from the incision and abdominal cramping/soreness. Inspect your incision site daily. Stand in front of a mirror to look for any redness, incision opening, or discolored/odorness drainage. Take a shower daily and continue good hygiene. Use own towel and washcloth (do not share). Make sure your sheets on your bed are clean. No pets sleeping around your incision site. Dressing will be removed at your postpartum visit. If the dressing does become wet or soiled underneath, it is okay to remove it.   Activity: Do not lift > 10 lbs for 6 weeks (do not lift anything heavier than your baby). No intercourse, tampons, swimming pools, hot tubs, baths (only showers) for 6 weeks.  No driving for 1-2 weeks. Continue prenatal vitamin, especially if breastfeeding. Increase calories and fluids (water) while breastfeeding.   Your milk will come in, in the next couple of days (right now it is colostrum). You may have a slight fever when your milk comes in, but it should go away on its  own.  If it does not, and rises above 101 F please call the doctor. You will also feel achy and your breasts will be firm. They will also start to leak. If you are breastfeeding, continue as you have been and you can pump/express milk for comfort.   If you have too much milk, your breasts can become engorged, which could lead to mastitis. This is an infection of the milk ducts. It can be very painful and you will need to notify your doctor to obtain a prescription for antibiotics. You can also treat it with a shower or hot/cold compress.   For concerns about your baby, please call your pediatrician.  For breastfeeding concerns, the lactation consultant can be reached at [phone].   Postpartum blues (feelings of happy one minute and sad another minute) are normal for the first few weeks but if it gets worse let your doctor know.   Congratulations! We enjoyed caring for you and your new bundle of joy!

## 2022-07-06 NOTE — Progress Notes (Signed)
Patient discharged home with infant. FOB present at discharge. Discharge instructions and prescriptions given and reviewed with patient. Patient verbalized understanding.   Explained importance of calling and scheduling follow-up appointment for BP and incision check for next week.   Also explained importance of watching out for s/s of high blood pressure and importance of taking medication 2x/day.   Provided pt with lidocaine patch to apply around incision when she gets home after 2015 tonight. Instructions given.  Will be escorted out by staff.

## 2022-07-06 NOTE — Progress Notes (Signed)
Rx Labetalol re-ordered again as [facility] pharmacy was closed. Rx sent to [facility] in [place].

## 2022-07-06 NOTE — Discharge Summary (Cosign Needed Addendum)
OB Discharge Summary     Patient Name: Audrey Clarke DOB: January 18, 1991 MRN: [id number]  Date of admission: 07/03/2022 Delivering MD: Harlin Heys Date of Delivery: 07/03/2022  Date of discharge: 07/06/2022  Admitting diagnosis: Gestational hypertension [O13.9] Intrauterine pregnancy: [redacted]w[redacted]d    Secondary diagnosis: Gestational Diabetes diet controlled (A1) and transverse fetal lie     Discharge diagnosis: Term Pregnancy Delivered, Cesarean Delivery, Gestational Hypertension, and GDM A1                                                                                                Post partum procedures: Iron infusion, Rhogam, Anti-hypertensive treatment   Augmentation: N/A  Complications: None  Hospital course:  Sceduled C/S   31y.o. yo G1P0000 at 341w3das admitted to the hospital 07/03/2022 for scheduled cesarean section with the following indication:Malpresentation.Delivery details are as follows:  Membrane Rupture Time/Date: 12:41 PM ,07/03/2022   Delivery Method:C-Section, Low Transverse  Details of operation can be found in separate operative note.    Patient had an uncomplicated postpartum course.  She is tolerating regular diet, her pain is controlled with PO medication, she is ambulating and voiding without difficulty, positive bowel sounds/flatus. She denies headache, visual changes or epigastric pain. She reports breastfeeding is going well. Precautions reviewed.  Patient is discharged home in stable condition on  07/06/22        Newborn Data: Birth date:07/03/2022  Birth time:12:44 PM  Gender:Female  Living status:Living  Apgars:8 ,8  Weight:4110 g     Physical exam  Vitals:   07/06/22 0400 07/06/22 0815 07/06/22 0845 07/06/22 1223  BP: (!) 152/78 (!) 147/99 (!) 157/88 134/75  Pulse: (!) 102 100 99 87  Resp: 18 18  18   Temp: 98.3 F (36.8 C) 98.5 F (36.9 C)  98.4 F (36.9 C)  TempSrc: Oral Oral  Oral  SpO2: 97% 99%  97%  Weight:      Height:        General: alert, cooperative, and no distress Lochia: appropriate Uterine Fundus: firm Incision: Healing well with no significant drainage DVT Evaluation: No evidence of DVT seen on physical exam.  Labs: Lab Results  Component Value Date   WBC 9.1 07/05/2022   HGB 7.0 (L) 07/05/2022   HCT 21.3 (L) 07/05/2022   MCV 99.1 07/05/2022   PLT 145 (L) 07/05/2022    Discharge instruction: per After Visit Summary.  Medications:  Allergies as of 07/06/2022       Reactions   Sertraline Other (See Comments)   Dizziness, sweats, panic   Augmentin [amoxicillin-pot Clavulanate] Diarrhea, Nausea And Vomiting        Medication List     STOP taking these medications    Accu-Chek Aviva Plus test strip Generic drug: glucose blood   Accu-Chek Aviva Plus w/Device Kit   Accu-Chek Softclix Lancets lancets   aspirin EC 81 MG tablet   cetirizine 10 MG tablet Commonly known as: ZYRTEC   famotidine 20 MG tablet Commonly known as: PEPCID   venlafaxine 75 MG tablet Commonly known as: EFFEXOR  TAKE these medications    co-enzyme Q-10 30 MG capsule Take 150 mg by mouth daily.   magnesium oxide 400 (240 Mg) MG tablet Commonly known as: MAG-OX Take 400 mg by mouth daily.   multivitamin-prenatal 27-0.8 MG Tabs tablet Take 1 tablet by mouth daily at 12 noon.   oxyCODONE 5 MG immediate release tablet Commonly known as: Oxy IR/ROXICODONE Take 1 tablet (5 mg total) by mouth every 6 (six) hours as needed for up to 5 days for severe pain.   Riboflavin 100 MG Caps Take 400 mg by mouth at bedtime.        Diet: carb modified diet  Activity: Advance as tolerated. Pelvic rest for 6 weeks.   Outpatient follow up:  Follow-up Information     Harlin Heys, MD. Schedule an appointment as soon as possible for a visit on 07/10/2022.   Specialties: Obstetrics and Gynecology, Radiology Why: Call and schedule a follow-up appointment for a visit in 1-week for an incision check  and a 6-week postpartum check with Dr. Amalia Hailey! Contact information: [address] [place] [address] [phone]                   Postpartum contraception: Progesterone only pills Rhogam Given postpartum: yes Rubella vaccine given postpartum: immune Varicella vaccine given postpartum: immune TDaP given antepartum or postpartum: given antepartum   Newborn Delivery   Birth date/time: 07/03/2022 12:44:00 Delivery type: C-Section, Low Transverse Trial of labor: No C-section categorization: Primary      Baby Feeding: Breast  Disposition:home with mother  SIGNED:  Rod Can, CNM 07/06/2022 4:02 PM

## 2022-07-08 ENCOUNTER — Encounter: Payer: 59 | Admitting: Licensed Practical Nurse

## 2022-07-09 ENCOUNTER — Ambulatory Visit (INDEPENDENT_AMBULATORY_CARE_PROVIDER_SITE_OTHER): Payer: 59 | Admitting: Obstetrics & Gynecology

## 2022-07-09 ENCOUNTER — Encounter: Payer: Self-pay | Admitting: Obstetrics & Gynecology

## 2022-07-09 DIAGNOSIS — Z4889 Encounter for other specified surgical aftercare: Secondary | ICD-10-CM

## 2022-07-09 NOTE — Progress Notes (Signed)
Subjective:     Audrey Clarke is a 31 y.o. female who presents for a postpartum visit. She is 1 week postpartum following a low cervical transverse Cesarean section. I have fully reviewed the prenatal and intrapartum course. The delivery was at 37 4/7 gestational weeks. Outcome: primary cesarean section, low transverse incision. Anesthesia: spinal. Postpartum course has been uncomplicated. Baby's course has been uncomplicated. Baby is feeding by both breast and bottle - not asked . Bleeding staining only. Bowel function is normal. Bladder function is normal. Patient is not sexually active. Contraception method is abstinence. Postpartum depression screening: negative.  The following portions of the patient's history were reviewed and updated as appropriate: allergies, current medications, past family history, past medical history, past social history, past surgical history, and problem list.  Review of Systems Pertinent items are noted in HPI.   Objective:    There were no vitals taken for this visit.  General:  alert, cooperative, and no distress   Breasts:    Lungs:   Heart:    Abdomen: deferred   Vulva:   Vagina:   Cervix:    Corpus:   Adnexa:    Rectal Exam:        Incision-clean, dry and intact Assessment:    Incision check visit postpartum exam.  Plan:    1. Contraception: abstinence 2. Incision is clean, dry and Intact 3. Follow up in: 5 weeks or as needed.

## 2022-07-15 ENCOUNTER — Encounter: Payer: 59 | Admitting: Obstetrics & Gynecology

## 2022-07-23 ENCOUNTER — Encounter: Payer: 59 | Admitting: Licensed Practical Nurse

## 2022-08-14 ENCOUNTER — Encounter: Payer: Self-pay | Admitting: Obstetrics

## 2022-08-14 ENCOUNTER — Ambulatory Visit (INDEPENDENT_AMBULATORY_CARE_PROVIDER_SITE_OTHER): Payer: 59 | Admitting: Obstetrics

## 2022-08-14 VITALS — BP 127/86 | HR 80 | Ht 66.0 in | Wt 207.0 lb

## 2022-08-14 DIAGNOSIS — Z4889 Encounter for other specified surgical aftercare: Secondary | ICD-10-CM

## 2022-08-14 DIAGNOSIS — Z3009 Encounter for other general counseling and advice on contraception: Secondary | ICD-10-CM

## 2022-08-14 DIAGNOSIS — O165 Unspecified maternal hypertension, complicating the puerperium: Secondary | ICD-10-CM

## 2022-08-14 DIAGNOSIS — O24410 Gestational diabetes mellitus in pregnancy, diet controlled: Secondary | ICD-10-CM

## 2022-08-14 MED ORDER — LABETALOL HCL 200 MG PO TABS: 200.0000 mg | ORAL_TABLET | Freq: Two times a day (BID) | ORAL | 0 refills | Status: DC

## 2022-08-14 MED ORDER — NORETHINDRONE 0.35 MG PO TABS: 1.0000 | ORAL_TABLET | Freq: Every day | ORAL | 3 refills | Status: DC

## 2022-08-14 NOTE — Progress Notes (Signed)
Postpartum Visit  Chief Complaint:  Chief Complaint  Patient presents with   Postpartum Care    Pt has been feeling a little "swimmy headed" sometimes after taking her BP meds, wondering if we need to change the dosage/stop meds.     History of Present Illness: Patient is a 31 y.o. G1P0000 presents for postpartum visit.She has continued to taek her Labetalol dialy, but has felt light headed recently. She denies any headache.  Date of delivery: 07/03/2022 Type of delivery: Vaginal delivery - Vacuum or forceps assisted  She had a primary CS for transverse lie Episiotomy No.  Laceration: not applicable  Pregnancy or labor problems:  yes, HTN of pregnancy; treated with Labetalol Any problems since the delivery:  no  Newborn Details:  SINGLETON :  1. Baby's name: girl. Birth weight: 4110gms Maternal Details:  Breast Feeding:  yes Post partum depression/anxiety noted:  no Edinburgh Post-Partum Depression Score:  2  Date of last PAP: 12/27/2020  normal   Past Medical History:  Diagnosis Date   Anemia    BRCA negative 07/2019   MyRisk neg   Depression    Family history of breast cancer    Gestational diabetes    History of PCOS    Hypertension    GESTATIONAL   IBS (irritable bowel syndrome)    Increased risk of breast cancer 07/2019   IBIS=27.7%/riskscore=22.9%   PCOS (polycystic ovarian syndrome)    Vitamin B12 deficiency    Vitamin D deficiency     Past Surgical History:  Procedure Laterality Date   CESAREAN SECTION N/A 07/03/2022   Procedure: CESAREAN SECTION;  Surgeon: Harlin Heys, MD;  Location: [facility] ORS;  Service: Obstetrics;  Laterality: N/A;   TONSILLECTOMY AND ADENOIDECTOMY     WISDOM TOOTH EXTRACTION      Prior to Admission medications   Medication Sig Start Date End Date Taking? Authorizing Provider  co-enzyme Q-10 30 MG capsule Take 150 mg by mouth daily.   Yes [provider]  labetalol (NORMODYNE) 300 MG tablet Take 1 tablet (300 mg total) by  mouth 2 (two) times daily. 07/06/22  Yes Rod Can, CNM  magnesium oxide (MAG-OX) 400 (240 Mg) MG tablet Take 400 mg by mouth daily.   Yes [provider]  Prenatal Vit-Fe Fumarate-FA (MULTIVITAMIN-PRENATAL) 27-0.8 MG TABS tablet Take 1 tablet by mouth daily at 12 noon.   Yes [provider]  Riboflavin 100 MG CAPS Take 400 mg by mouth at bedtime.   Yes [provider]    Allergies  Allergen Reactions   Sertraline Other (See Comments)    Dizziness, sweats, panic   Augmentin [Amoxicillin-Pot Clavulanate] Diarrhea and Nausea And Vomiting     Social History   Socioeconomic History   Marital status: Married    Spouse name: Shalaine Payson   Number of children: Not on file   Years of education: Not on file   Highest education level: Not on file  Occupational History   Occupation: Warehouse manager    Comment: [employer]  Tobacco Use   Smoking status: Never   Smokeless tobacco: Never  Vaping Use   Vaping Use: Never used  Substance and Sexual Activity   Alcohol use: Never   Drug use: Never   Sexual activity: Yes    Birth control/protection: Pill  Other Topics Concern   Not on file  Social History Narrative   Not on file   Social Determinants of Health   Financial Resource Strain: Not on file  Food Insecurity: No Food Insecurity (07/03/2022)   Hunger Vital Sign    Worried About Running Out of Food in the Last Year: Never true    Ran Out of Food in the Last Year: Never true  Transportation Needs: No Transportation Needs (07/03/2022)   PRAPARE - Hydrologist (Medical): No    Lack of Transportation (Non-Medical): No  Physical Activity: Not on file  Stress: Not on file  Social Connections: Not on file  Intimate Partner Violence: Not At Risk (07/03/2022)   Humiliation, Afraid, Rape, and Kick questionnaire    Fear of Current or Ex-Partner: No    Emotionally Abused: No    Physically Abused: No    Sexually Abused: No     Family History  Problem Relation Age of Onset   Asthma Mother    Hypertension Mother    Osteoarthritis Mother    Breast cancer Mother 73   Hypertension Father    Breast cancer Maternal Grandmother 3   Diabetes Maternal Grandfather    Prostate cancer Maternal Grandfather    Diabetes Paternal Grandfather    Hypertension Paternal Grandfather    Alzheimer's disease Paternal Grandfather    Hypertension Maternal Aunt    Diabetes Maternal Aunt    Breast cancer Maternal Aunt 45    Review of Systems  Constitutional: Negative.   HENT: Negative.    Eyes: Negative.   Respiratory: Negative.    Cardiovascular: Negative.   Gastrointestinal: Negative.   Genitourinary: Negative.   Musculoskeletal: Negative.   Skin: Negative.   Neurological: Negative.   Endo/Heme/Allergies: Negative.   Psychiatric/Behavioral: Negative.       Physical Exam BP 127/86   Pulse 80   Ht 5' 6"  (1.676 m)   Wt 93.9 kg   BMI 33.41 kg/m   Physical Exam Constitutional:      Appearance: Normal appearance. She is obese.  Genitourinary:     Vulva and rectum normal.     Genitourinary Comments: Uterus is involuted. No external lesions or areas of irritation.  Cardiovascular:     Rate and Rhythm: Normal rate and regular rhythm.     Pulses: Normal pulses.     Heart sounds: Normal heart sounds.  Pulmonary:     Effort: Pulmonary effort is normal.     Breath sounds: Normal breath sounds.  Abdominal:     Palpations: Abdomen is soft.     Comments: CS scar is well healed.  Musculoskeletal:        General: Normal range of motion.     Cervical back: Normal range of motion and neck supple.  Neurological:     General: No focal deficit present.     Mental Status: She is alert and oriented to person, place, and time.  Skin:    General: Skin is warm and dry.  Vitals reviewed.      Female Chaperone present during breast and/or pelvic exam.  Assessment: 31 y.o. G1P0000 presenting for 6 week postpartum  visit  Plan: Problem List Items Addressed This Visit   None    1) Contraception Education given regarding options for contraception, including oral contraceptives.  2)  Pap - ASCCP guidelines and rational discussed.  Patient opts for annual screening interval  3) Patient underwent screening for postpartum depression with no concerns noted.  4) Follow up 1 year for routine annual exam  We talked about her delivery and PP course. She would desire a VBAC with her next pregnancy. I have sent in  a RX for POPs. She knows to switch to combination OCPs if she waens or at 6 months PP. Regarding her Labetalol, we will drop her dosage to 200 mg PO BID, and she will return in 3 weeks for a 2 hr GTT and a BP check. Imagene Riches, CNM  08/14/2022 9:16 AM \  08/14/2022 9:16 AM

## 2022-08-28 ENCOUNTER — Telehealth: Payer: Self-pay

## 2022-08-28 NOTE — Telephone Encounter (Signed)
Pt left msg on triage requesting f/u on some papers that were faxed to [place] regarding her FMLA.

## 2022-08-29 NOTE — Telephone Encounter (Signed)
Faxing visit note from 10/18

## 2022-09-03 ENCOUNTER — Telehealth: Payer: Self-pay

## 2022-09-03 NOTE — Telephone Encounter (Signed)
Myra from [employer] calling; claim # [id number]; they need additional clinical information; a form was faxed to [phone] in Oct; have we received it?  Please fill out form and fax it to [phone]; Ph# [phone] ext [phone].

## 2022-09-04 ENCOUNTER — Ambulatory Visit (INDEPENDENT_AMBULATORY_CARE_PROVIDER_SITE_OTHER): Payer: 59

## 2022-09-04 ENCOUNTER — Telehealth: Payer: Self-pay

## 2022-09-04 ENCOUNTER — Other Ambulatory Visit: Payer: 59

## 2022-09-04 VITALS — BP 104/71 | HR 69 | Wt 210.0 lb

## 2022-09-04 DIAGNOSIS — O24410 Gestational diabetes mellitus in pregnancy, diet controlled: Secondary | ICD-10-CM

## 2022-09-04 DIAGNOSIS — O165 Unspecified maternal hypertension, complicating the puerperium: Secondary | ICD-10-CM

## 2022-09-04 NOTE — Telephone Encounter (Signed)
Completed form and faxed 

## 2022-09-04 NOTE — Telephone Encounter (Signed)
Audrey Clarke came in today for a blood pressure check and a 2 hr GTT.   Blood pressure was 107/71. She asked if she needed to continue the Labetalol or does she stop it?

## 2022-09-04 NOTE — Progress Notes (Signed)
Patient present for a blood pressure check today. Blood pressure was good 104/71.

## 2022-09-05 LAB — GLUCOSE TOLERANCE, 2 HOURS
Glucose, 2 hour: 75 mg/dL (ref 70–139)
Glucose, GTT - Fasting: 101 mg/dL — ABNORMAL HIGH (ref 70–99)

## 2022-09-13 ENCOUNTER — Ambulatory Visit (INDEPENDENT_AMBULATORY_CARE_PROVIDER_SITE_OTHER): Payer: 59

## 2022-09-13 VITALS — BP 120/88 | Ht 66.0 in | Wt 211.0 lb

## 2022-09-13 DIAGNOSIS — O165 Unspecified maternal hypertension, complicating the puerperium: Secondary | ICD-10-CM

## 2022-09-13 NOTE — Progress Notes (Signed)
Pt was here for a BP check 120/88. Pt states she feels fine.

## 2022-12-13 IMAGING — US US OB < 14 WEEKS - US OB TV
1 series · 14 of 28 positions shown · non-contrast
Comparison: None

CLINICAL DATA: First trimester pregnancy, assessment of dating and
viability, LMP 10/14/2021

EXAM:
OBSTETRIC <14 WK US AND TRANSVAGINAL OB US
TECHNIQUE: Both transabdominal and transvaginal ultrasound examinations were
performed for complete evaluation of the gestation as well as the
maternal uterus, adnexal regions, and pelvic cul-de-sac.
Transvaginal technique was performed to assess early pregnancy.

[Series 1: us ob less than 14 weeks with ob transvaginal · 126 acquisitions, 14 frames shown]
[im 5/126]
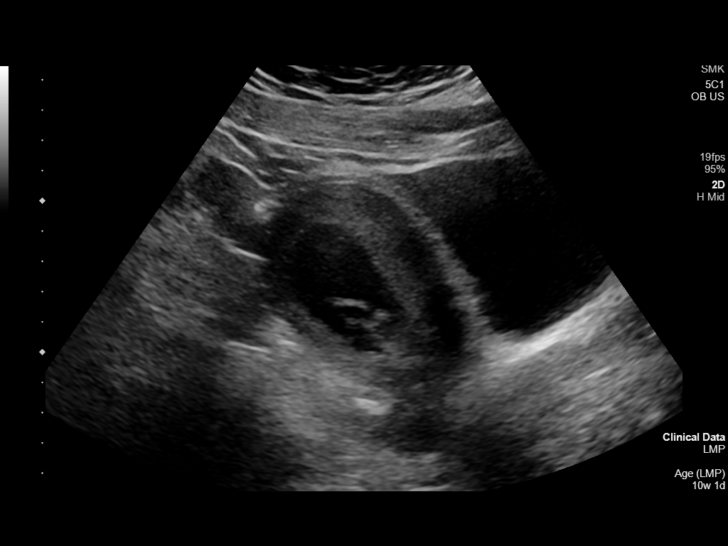
[im 14/126]
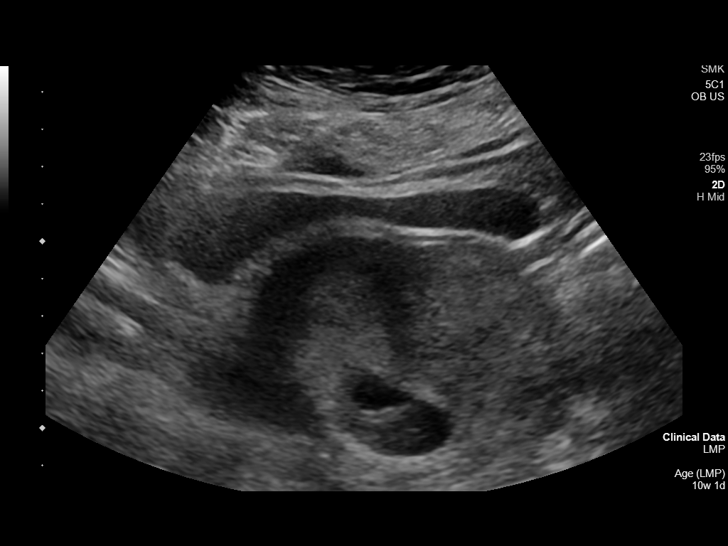
[im 24/126]
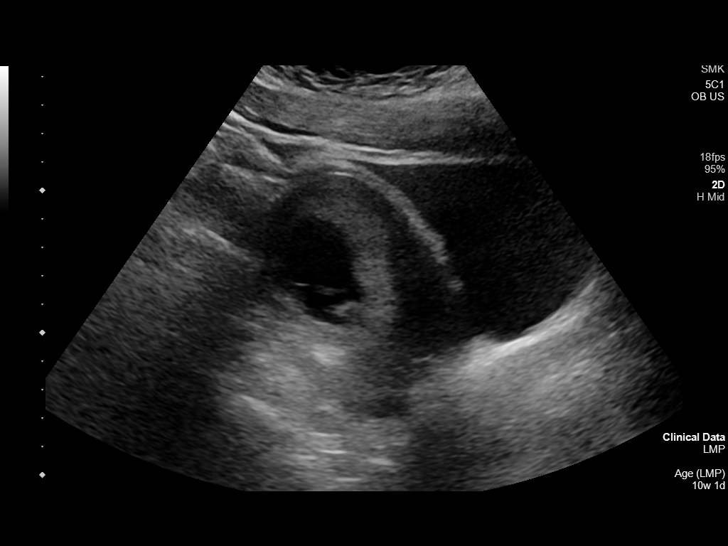
[im 33/126]
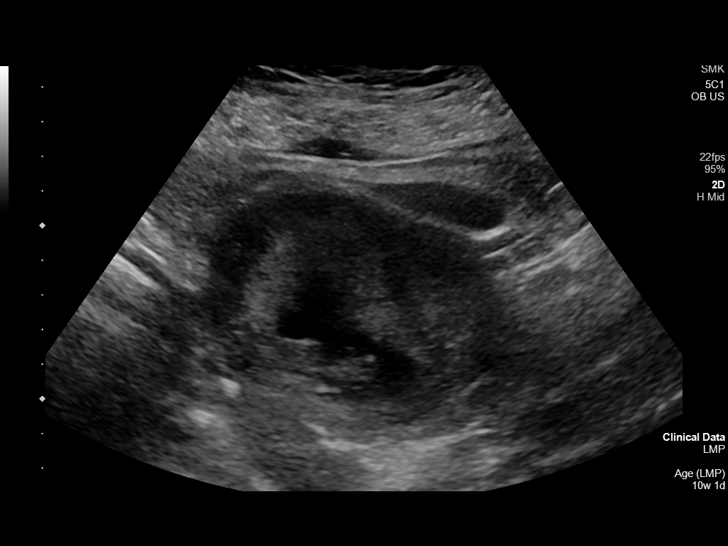
[im 42/126]
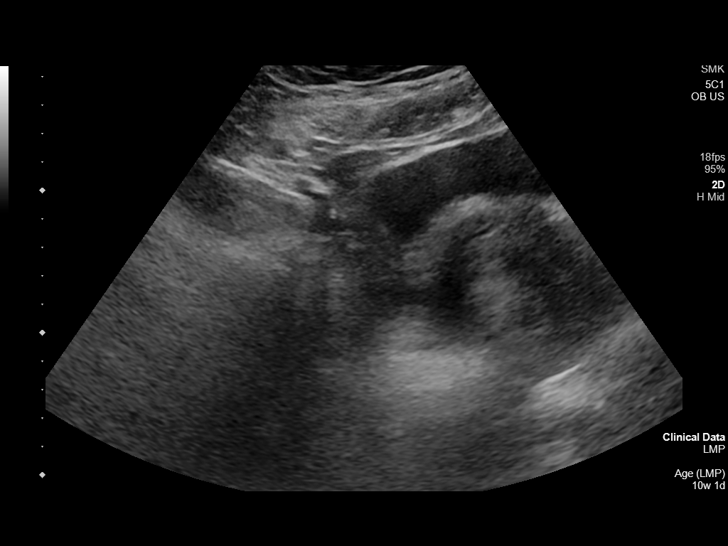
[im 51/126]
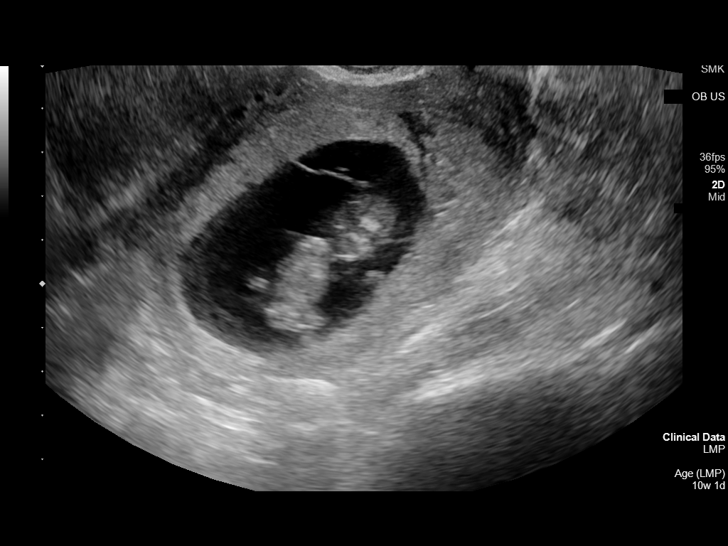
[im 61/126]
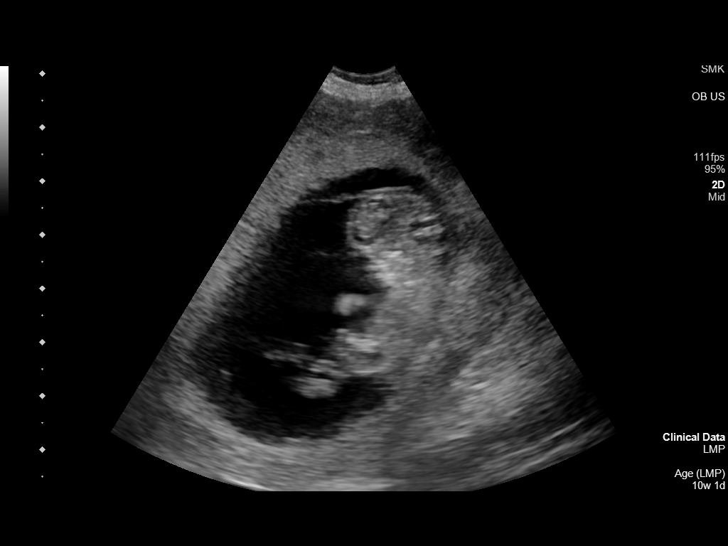
[im 70/126]
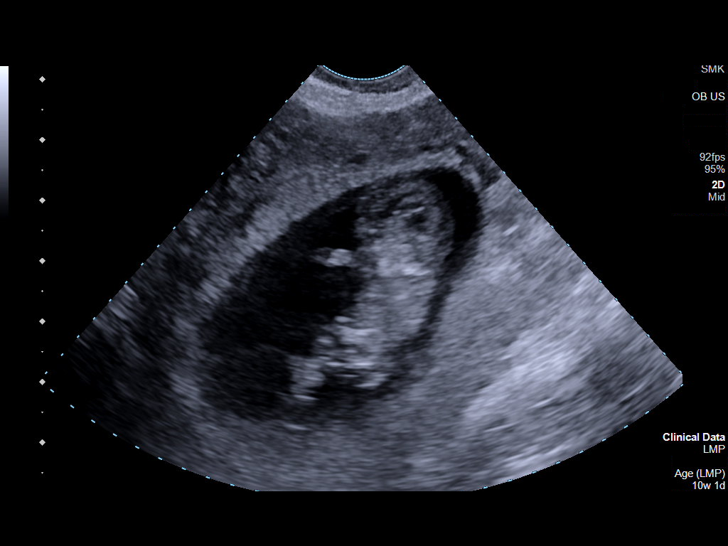
[im 79/126]
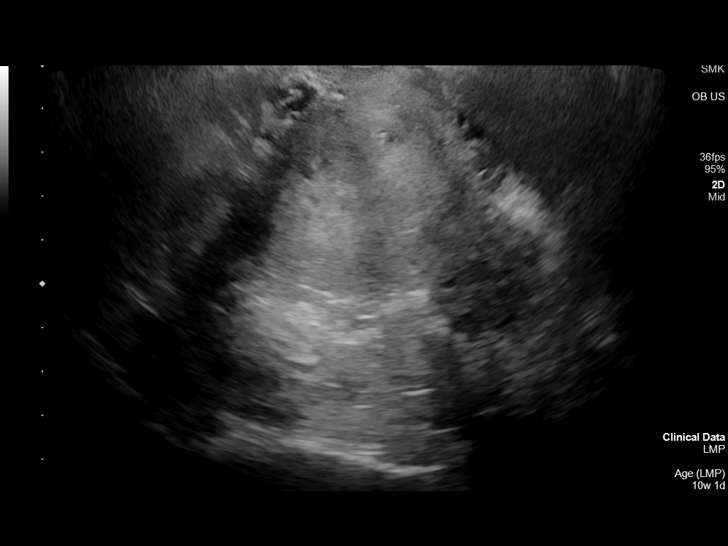
[im 88/126]
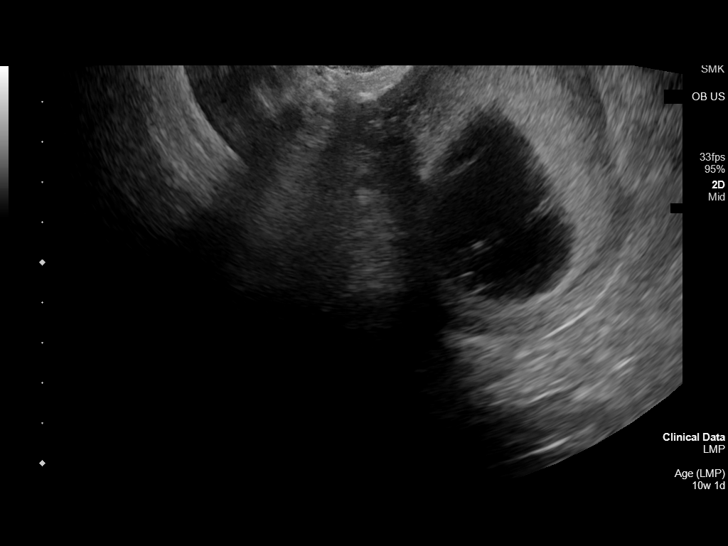
[im 98/126]
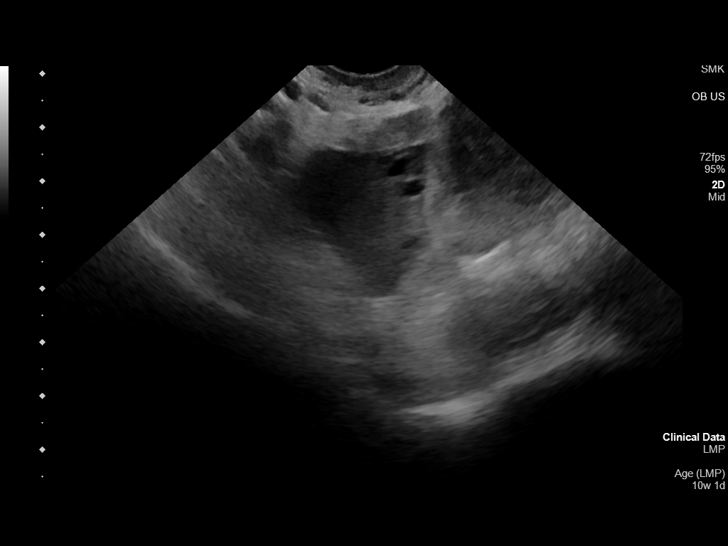
[im 107/126]
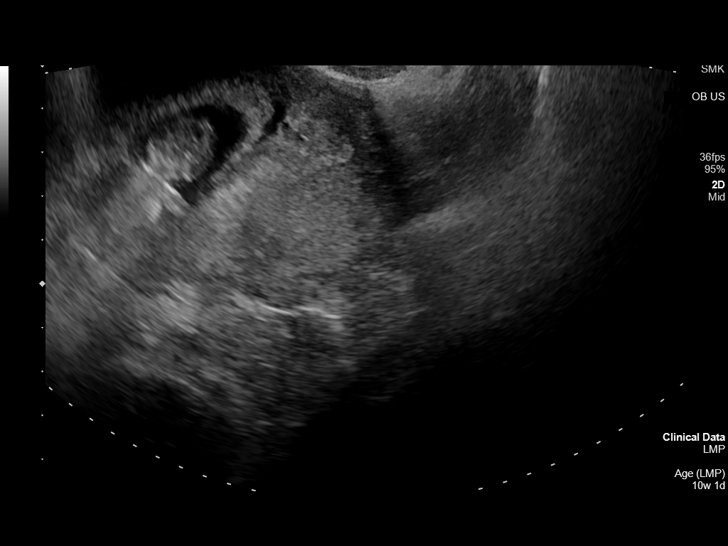
[im 116/126]
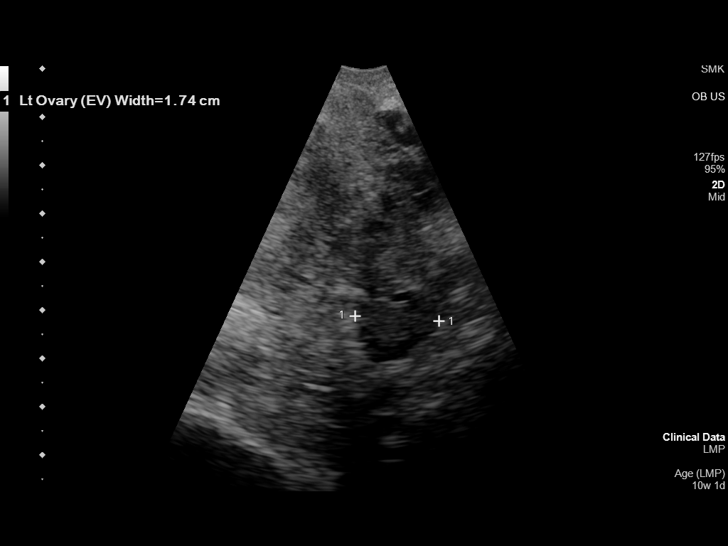
[im 126/126]
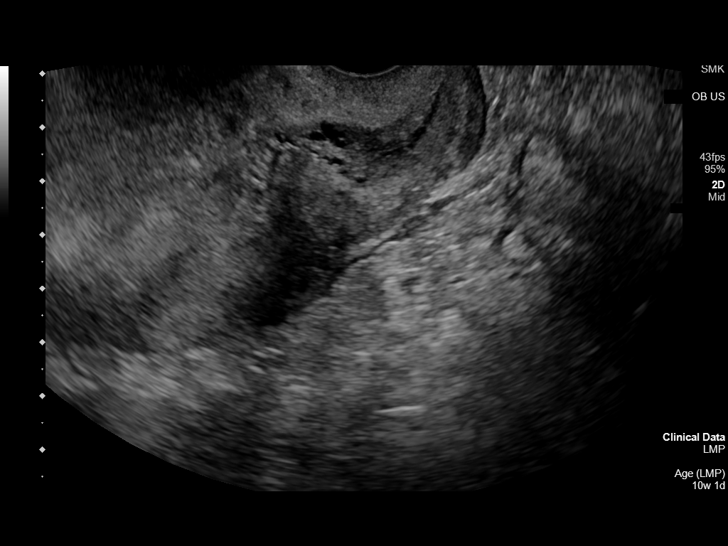

[14 of 28 positions shown; findings below may reference images not displayed]

FINDINGS: Intrauterine gestational sac: Present, single

Yolk sac:  Not identified

Embryo:  Present

Cardiac Activity: Present

Heart Rate: 169 bpm

CRL:  37.3 mm   10 w   4 d                  US EDC: 07/18/2022

Subchorionic hemorrhage:  Trace subchronic hemorrhage

Maternal uterus/adnexae:

Ovaries unremarkable.

Uterus anteverted, otherwise normal appearance.

No free pelvic fluid or adnexal masses.
IMPRESSION: Single live intrauterine gestation at 10 weeks 4 days EGA by
crown-rump length.

Trace subchronic hemorrhage.

## 2023-02-10 IMAGING — US US OB COMP +14 WK
1 series · 15 of 28 positions shown · non-contrast
Comparison: none

CLINICAL DATA: Second trimester pregnancy for fetal anatomy survey.

EXAM:
OBSTETRICAL ULTRASOUND >14 WKS

[Series 1: us ob comp + 14 wk · 15 of 67 slices shown]
[im 1/67]
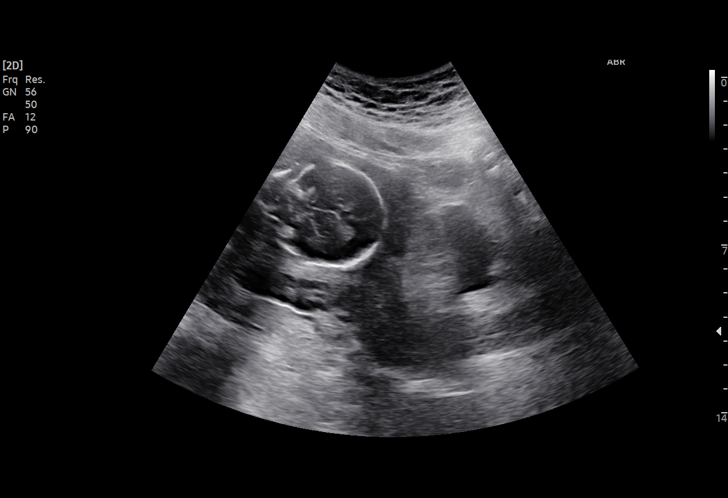
[im 5/67]
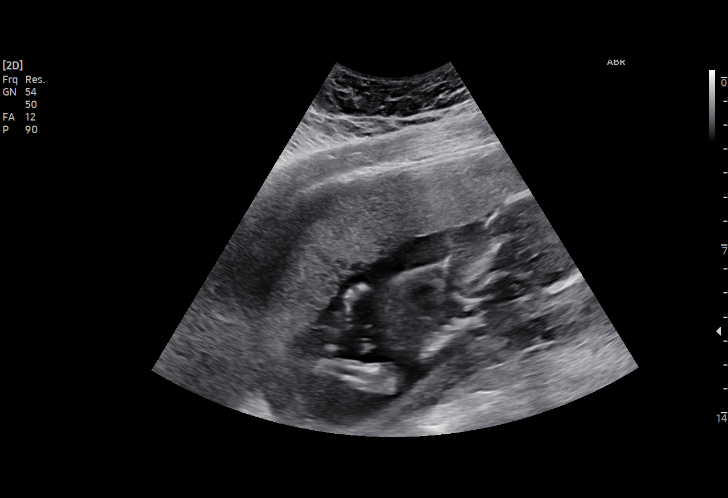
[im 10/67]
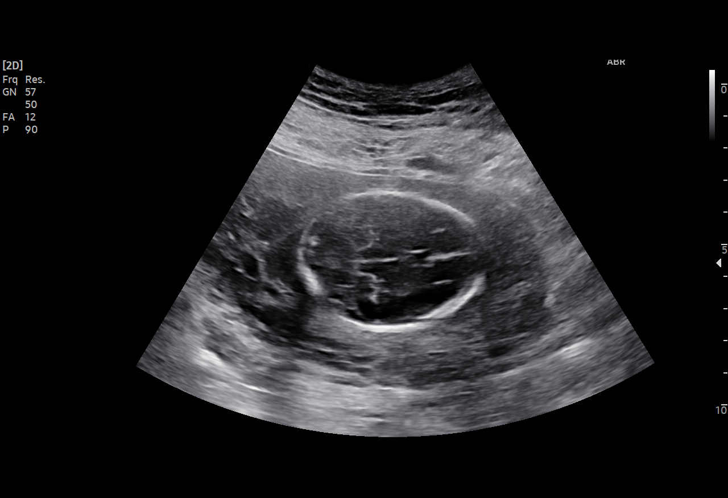
[im 15/67]
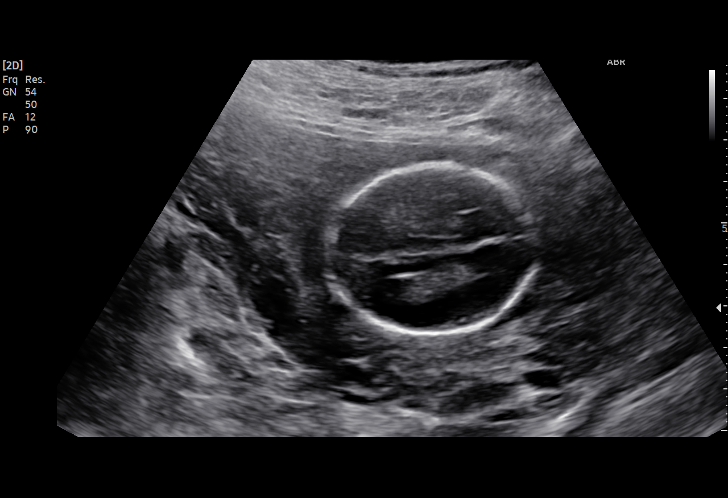
[im 20/67]
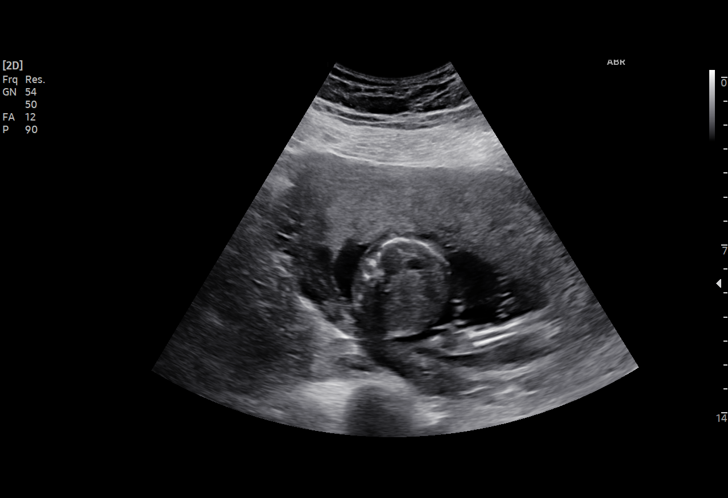
[im 25/67]
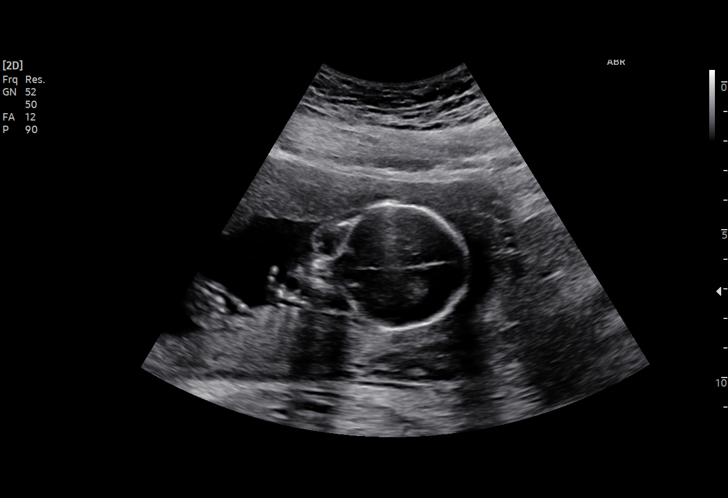
[im 30/67]
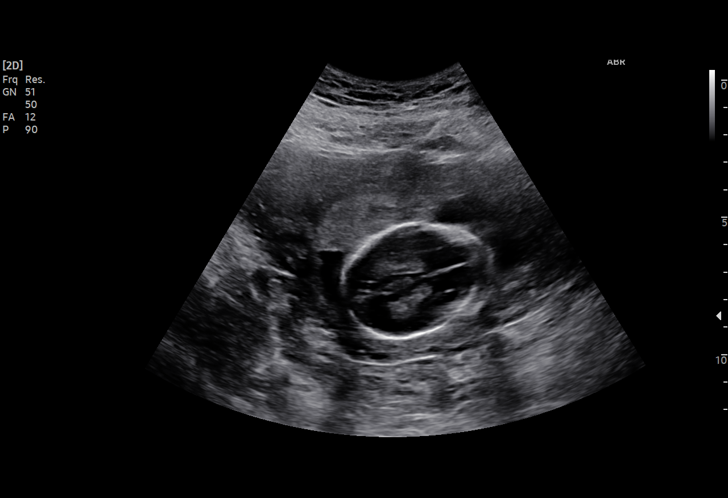
[im 35/67]
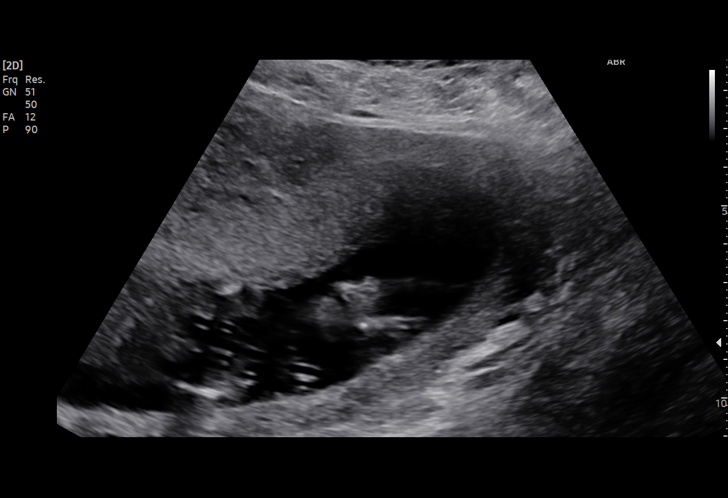
[im 37/67]
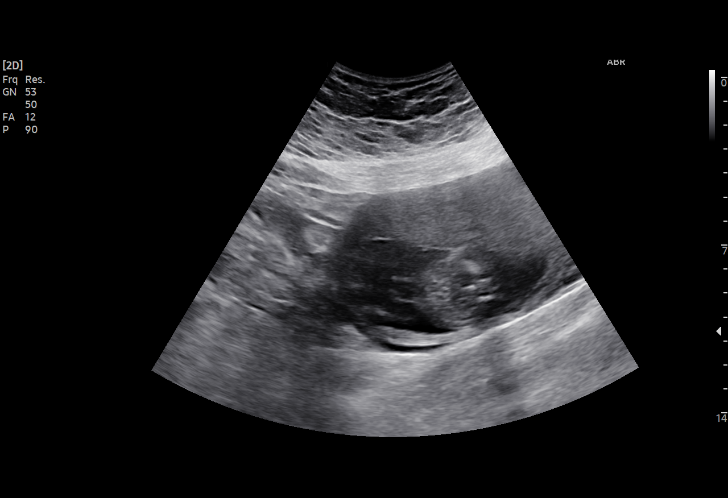
[im 42/67]
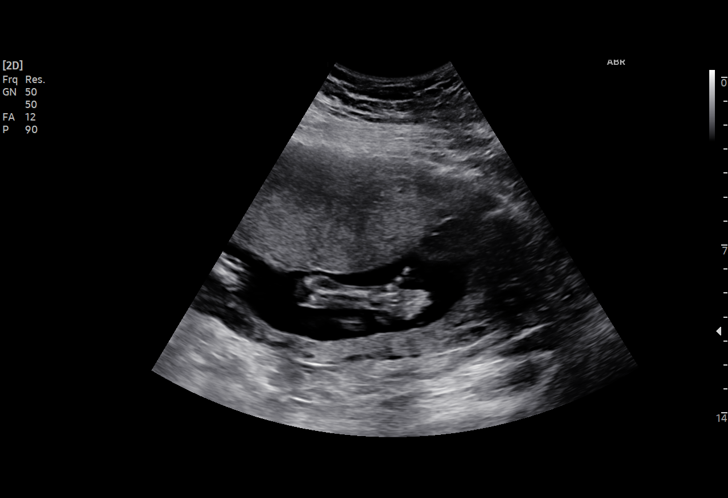
[im 47/67]
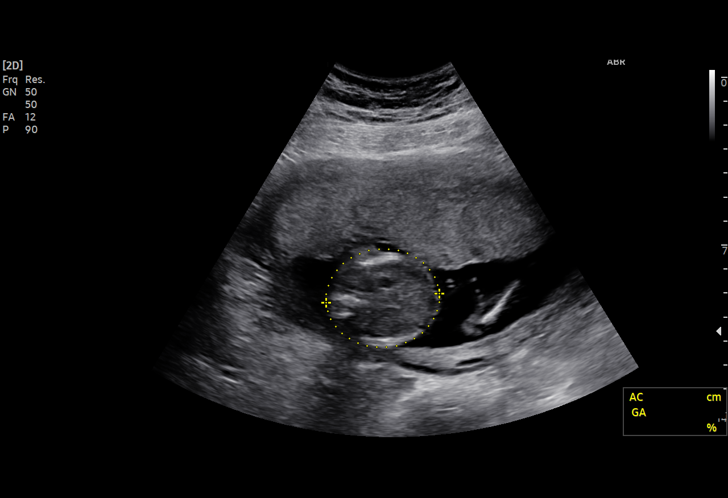
[im 52/67]
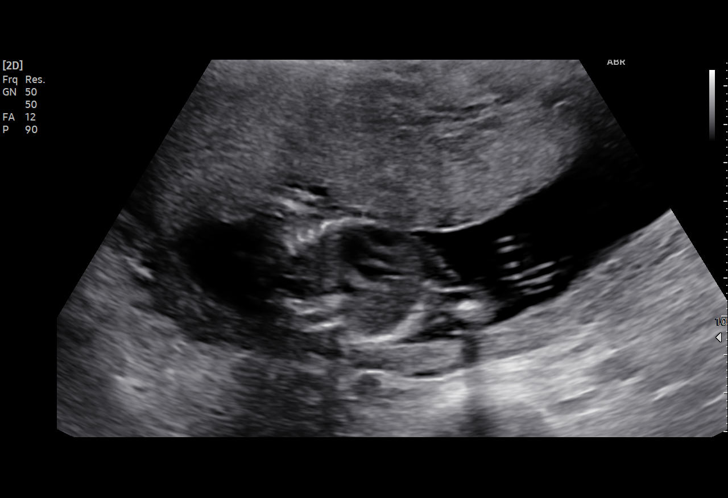
[im 57/67]
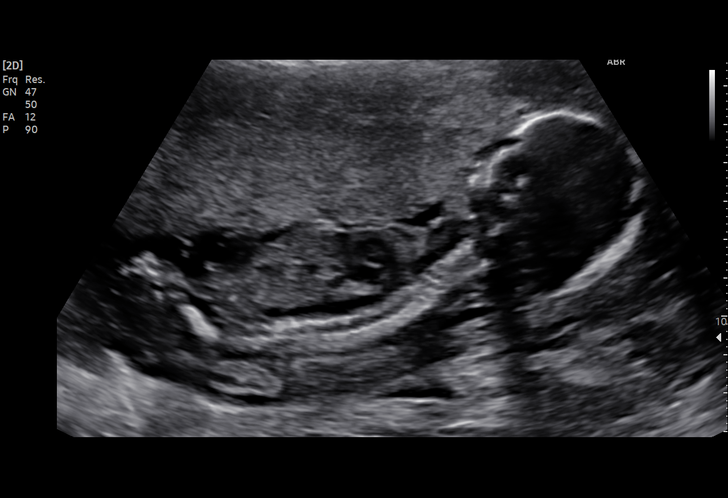
[im 62/67]
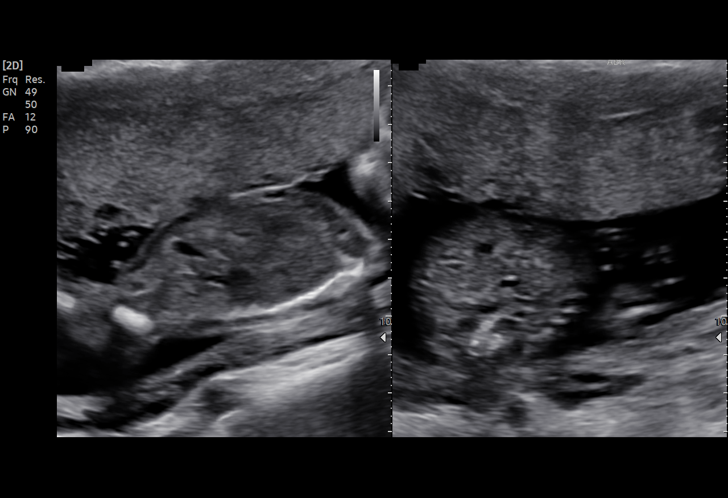
[im 67/67]
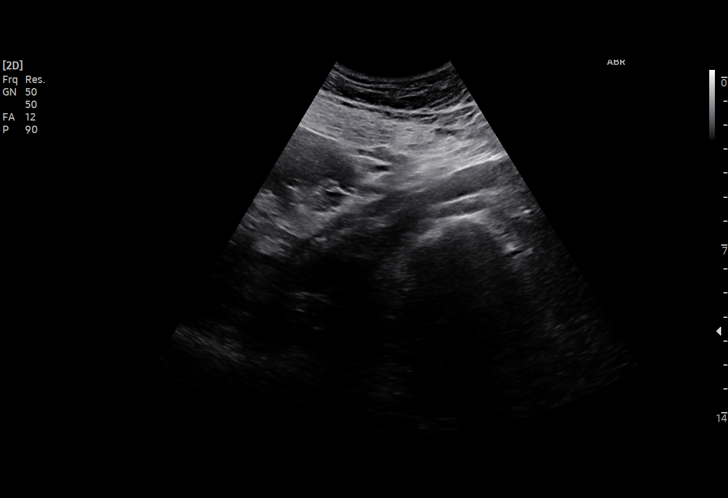

[15 of 28 positions shown; findings below may reference images not displayed]

FINDINGS: Number of Fetuses: 1

Heart Rate:  157 bpm

Movement: Yes

Presentation: Cephalic

Previa: No

Placental Location: Anterior

Amniotic Fluid (Subjective): Within normal limits

Amniotic Fluid (Objective):

Vertical pocket = 3.9cm

FETAL BIOMETRY

BPD: 4.2cm 18w 5d

HC:   16.5cm 19w 2d

AC:   13.7cm 19w 1d

FL:   2.3cm 18w 4d

Current Mean GA: 18w 4d US EDC: 07/21/2022

Assigned GA:  18w 4d assigned EDC: 07/21/2022

FETAL ANATOMY

Lateral Ventricles: Appears normal

Thalami/CSP: Appears normal

Posterior Fossa:  Appears normal

Nuchal Region: Appears normal   NFT= 3 mm

Upper Lip: Appears normal

Spine: Not visualized

4 Chamber Heart on Left: Appears normal

LVOT: Not visualized

RVOT: Not visualized

Stomach on Left: Appears normal

3 Vessel Cord: Appears normal

Cord Insertion site: Appears normal

Kidneys: Appears normal

Bladder: Not visualized

Extremities: Appears normal

Sex: Not Visualized

Technically difficult due to: Fetal position and maternal habitus

Maternal Findings:

Cervix:  3.2 cm TA
IMPRESSION: Assigned GA currently eighteen weeks 4 days. Appropriate fetal
growth.

No fetal anomalies identified, although spine, cardiac outflow
tracts, bladder, and genitalia could not be visualized. Consider
followup ultrasound in 3-4 weeks to complete anatomic evaluation.

## 2023-03-02 IMAGING — US US OB FOLLOW-UP
1 series · 15 of 28 positions shown · non-contrast
Comparison: none

CLINICAL DATA: Incomplete fetal anatomic survey previously

EXAM:
OBSTETRIC 14+ WK ULTRASOUND FOLLOW-UP

[Series 1: us ob follow up · 15 of 37 slices shown]
[im 1/37]
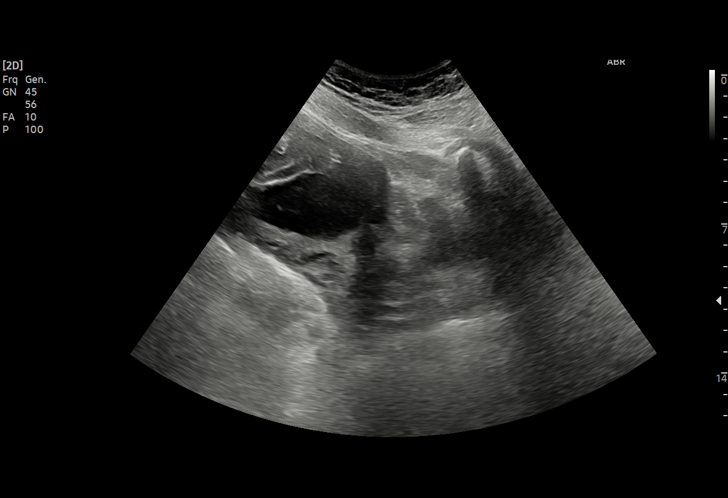
[im 3/37]
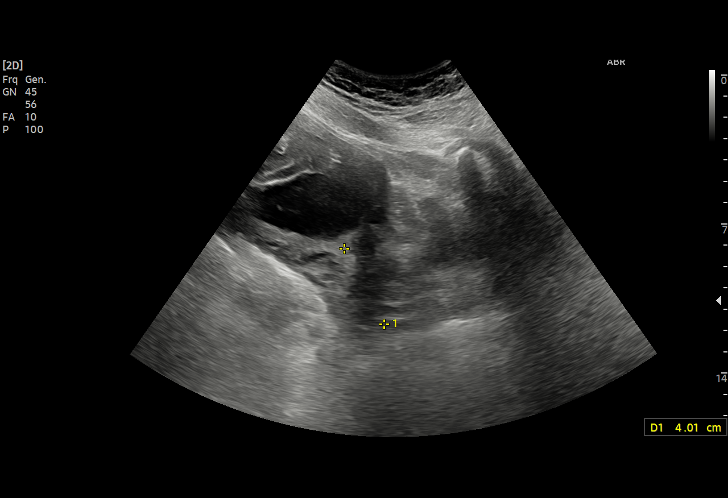
[im 6/37]
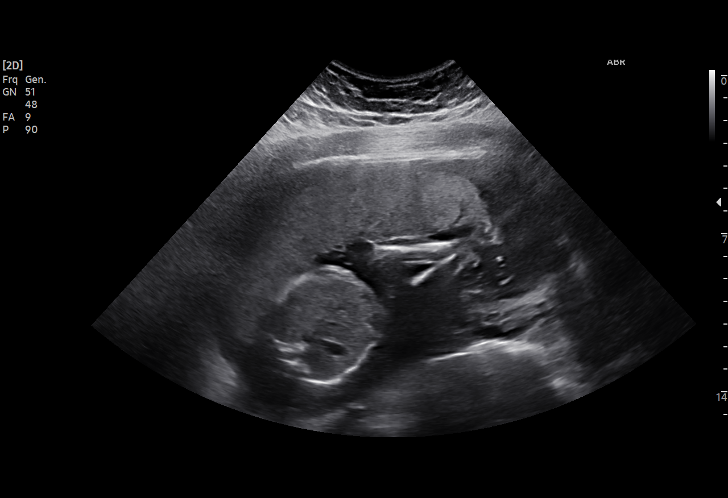
[im 9/37]
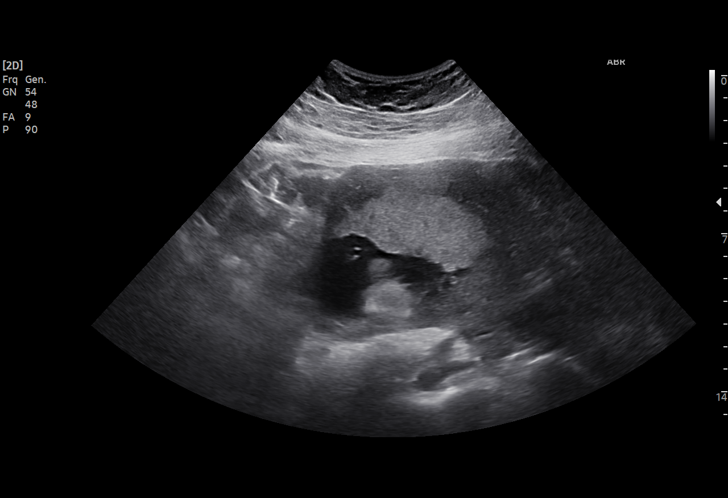
[im 11/37]
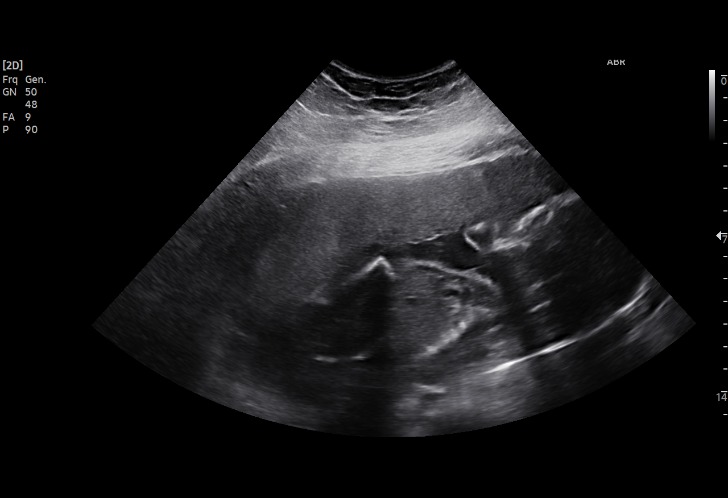
[im 14/37]
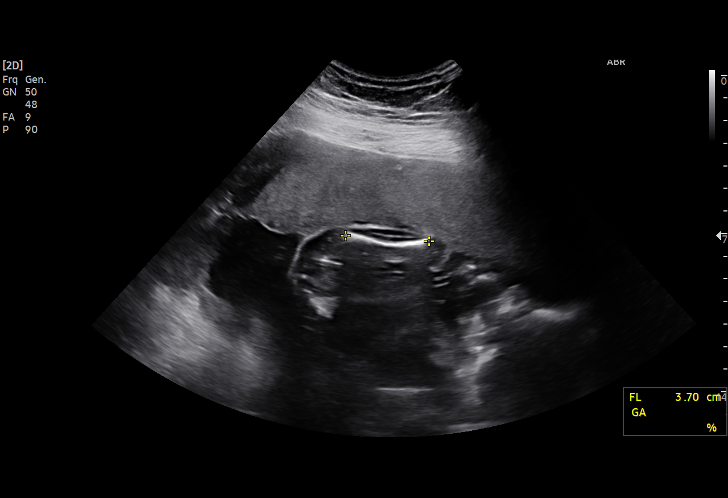
[im 17/37]
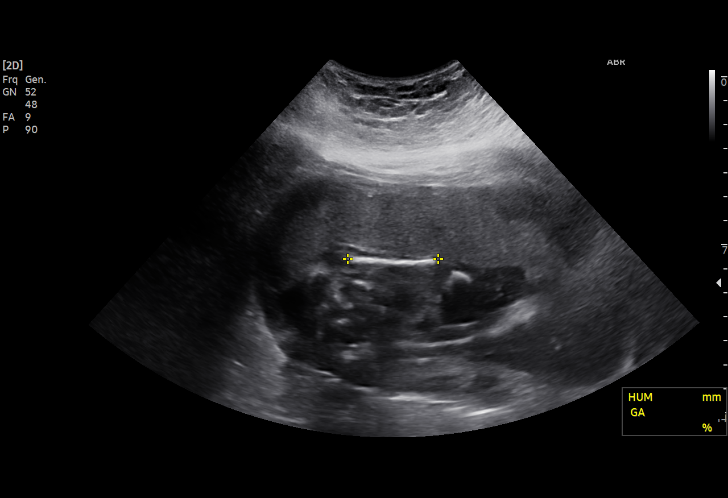
[im 19/37]
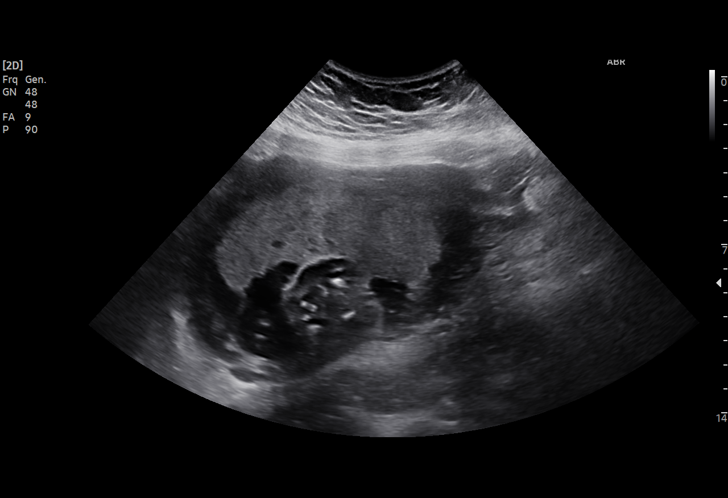
[im 21/37]
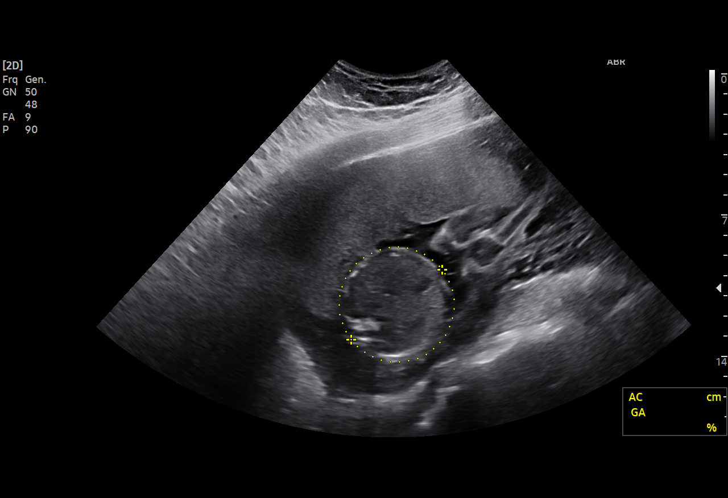
[im 23/37]
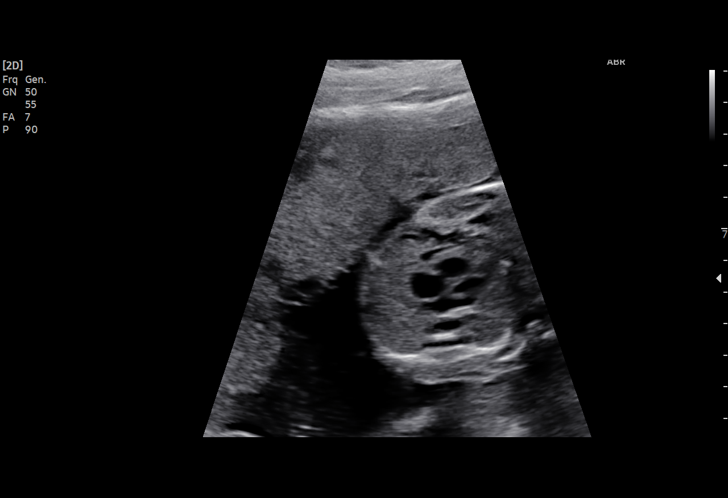
[im 26/37]
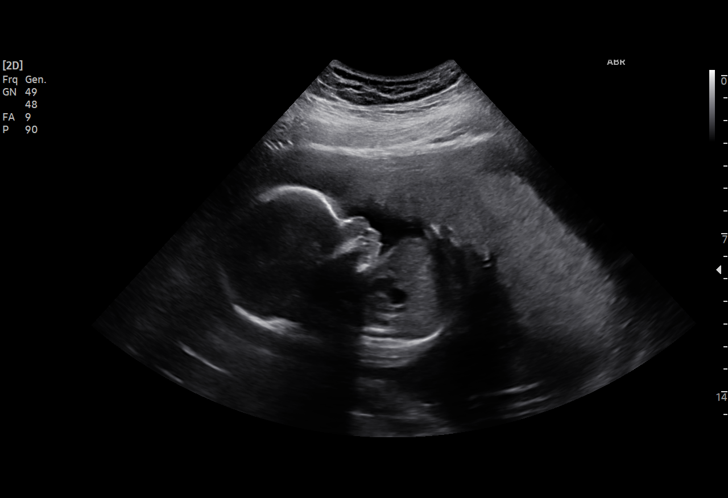
[im 29/37]
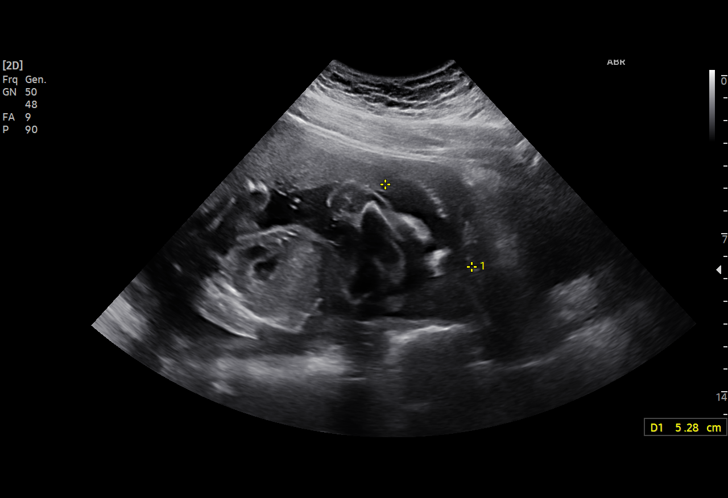
[im 31/37]
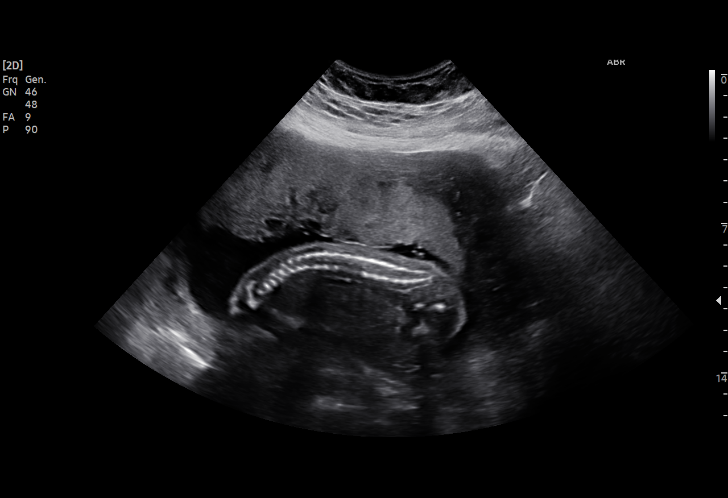
[im 34/37]
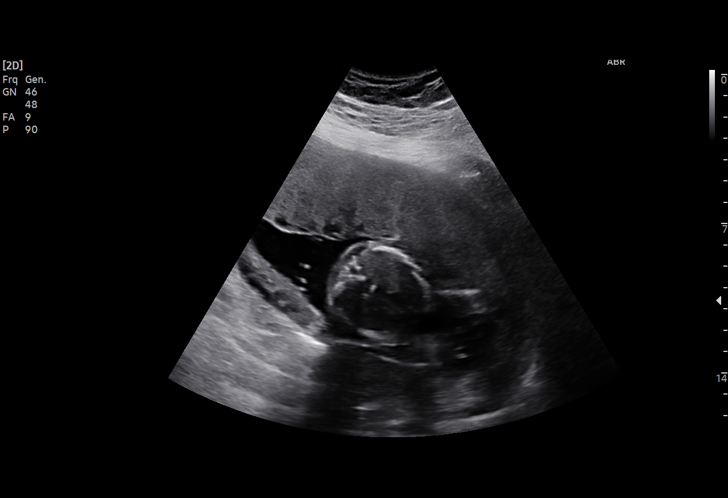
[im 37/37]
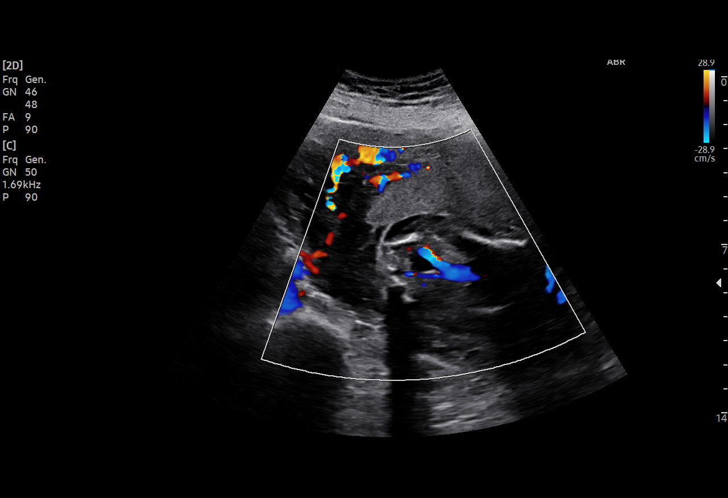

[15 of 28 positions shown; findings below may reference images not displayed]

FINDINGS: Number of Fetuses: 1

Heart Rate:  152 bpm

Movement: Yes

Presentation: Transverse

Previa: No

Placental Location: Anterior

Amniotic Fluid (Subjective): Normal

Amniotic Fluid (Objective):

Vertical pocket 3.1cm

FETAL BIOMETRY

BPD:  5.0cm 21w 0d

HC:    19.5cm 21w 5d

AC:    17.7cm 22w 4d

FL:    3.7cm 21w 4d

Current Mean GA: 22w 0d US EDC: 07/17/2022

Assigned GA: 21w 3d Assigned EDC: 07/21/2022

FETAL ANATOMY

Spine: Cervicothoracic spine are suboptimally visualized.
Lumbosacral spine appears grossly unremarkable.

4 Chamber Heart on Left: Appears normal

LVOT: Not visualized

RVOT: Not visualized

Bladder: Appears normal

The remainder of the anatomic survey was evaluated previously and
unremarkable.

Technical Limitations: Fetal position

Maternal Findings:

Cervix:  4.0 cm
IMPRESSION: 1. Single live intrauterine pregnancy as above estimated age 22
weeks and 0 days.
2. Continued suboptimal visualization of the spine and cardiac
outflow tracts. Normal appearance of the four-chamber heart,
bladder, and female genitalia. Short interval follow-up could be
performed.

## 2023-07-13 ENCOUNTER — Other Ambulatory Visit: Payer: Self-pay | Admitting: Obstetrics

## 2023-07-13 DIAGNOSIS — Z3009 Encounter for other general counseling and advice on contraception: Secondary | ICD-10-CM

## 2023-08-15 ENCOUNTER — Other Ambulatory Visit: Payer: Self-pay | Admitting: Obstetrics

## 2023-08-15 ENCOUNTER — Encounter: Payer: Self-pay | Admitting: Obstetrics

## 2023-08-15 DIAGNOSIS — Z3009 Encounter for other general counseling and advice on contraception: Secondary | ICD-10-CM

## 2023-08-28 MED ORDER — NORETHINDRONE 0.35 MG PO TABS: 1.0000 | ORAL_TABLET | Freq: Every day | ORAL | 3 refills | Status: DC

## 2023-08-28 NOTE — Telephone Encounter (Signed)
Pt calling to see why her bc refill was denied; adv she needs her annual exam scheduled and then refills will be sent to get her to her appt.  Pt to watch for call from schedulers.

## 2023-08-28 NOTE — Addendum Note (Signed)
Addended by: Loran Senters D on: 08/28/2023 01:26 PM   Modules accepted: Orders

## 2023-09-10 ENCOUNTER — Encounter: Payer: Self-pay | Admitting: Obstetrics and Gynecology

## 2023-09-10 ENCOUNTER — Ambulatory Visit (INDEPENDENT_AMBULATORY_CARE_PROVIDER_SITE_OTHER): Payer: 59 | Admitting: Obstetrics and Gynecology

## 2023-09-10 VITALS — BP 121/86 | HR 92 | Ht 66.0 in | Wt 221.1 lb

## 2023-09-10 DIAGNOSIS — Z01419 Encounter for gynecological examination (general) (routine) without abnormal findings: Secondary | ICD-10-CM

## 2023-09-10 DIAGNOSIS — Z3009 Encounter for other general counseling and advice on contraception: Secondary | ICD-10-CM

## 2023-09-10 MED ORDER — LEVONORGEST-ETH ESTRAD 91-DAY 0.15-0.03 &0.01 MG PO TABS
1.0000 | ORAL_TABLET | Freq: Every day | ORAL | 3 refills | Status: DC
Start: 2023-09-10 — End: 2024-09-03

## 2023-09-10 MED ORDER — NORETHINDRONE 0.35 MG PO TABS: 1.0000 | ORAL_TABLET | Freq: Every day | ORAL | 3 refills | Status: DC

## 2023-09-10 NOTE — Progress Notes (Signed)
HPI:      [name] is a 32 y.o. G1P0001 who LMP was Patient's last menstrual period was 08/20/2023.  Subjective:   She presents today for her annual examination.  She is taking Micronor and having monthly cycles.  She would like to switch to a combination OCP. Of significant note, she has a remote history of PCOS. She has previously failed Mirena twice with irregular bleeding and cramping.    Hx: The following portions of the patient's history were reviewed and updated as appropriate:             She  has a past medical history of Anemia, BRCA negative (07/2019), Cesarean delivery delivered (07/06/2022), Depression, Family history of breast cancer, Gestational diabetes, History of PCOS, Hypertension, IBS (irritable bowel syndrome), Increased risk of breast cancer (07/2019), Obesity affecting pregnancy (12/27/2021), PCOS (polycystic ovarian syndrome), Vitamin B12 deficiency, and Vitamin D deficiency. She does not have any pertinent problems on file. She  has a past surgical history that includes Wisdom tooth extraction; Tonsillectomy and adenoidectomy; and Cesarean section (N/A, 07/03/2022). Her family history includes Alzheimer's disease in her paternal grandfather; Asthma in her mother; Breast cancer (age of onset: 77) in her maternal aunt; Breast cancer (age of onset: 52) in her mother; Breast cancer (age of onset: 70) in her maternal grandmother; Diabetes in her maternal aunt, maternal grandfather, and paternal grandfather; Hypertension in her father, maternal aunt, mother, and paternal grandfather; Osteoarthritis in her mother; Prostate cancer in her maternal grandfather. She  reports that she has never smoked. She has never used smokeless tobacco. She reports that she does not drink alcohol and does not use drugs. She has a current medication list which includes the following prescription(s): co-enzyme q-10, cyanocobalamin, levonorgestrel-ethinyl estradiol, magnesium oxide,  multivitamin-prenatal, pyridoxine hcl, and riboflavin. She is allergic to sertraline and augmentin [amoxicillin-pot clavulanate].       Review of Systems:  Review of Systems  Constitutional: Denied constitutional symptoms, night sweats, recent illness, fatigue, fever, insomnia and weight loss.  Eyes: Denied eye symptoms, eye pain, photophobia, vision change and visual disturbance.  Ears/Nose/Throat/Neck: Denied ear, nose, throat or neck symptoms, hearing loss, nasal discharge, sinus congestion and sore throat.  Cardiovascular: Denied cardiovascular symptoms, arrhythmia, chest pain/pressure, edema, exercise intolerance, orthopnea and palpitations.  Respiratory: Denied pulmonary symptoms, asthma, pleuritic pain, productive sputum, cough, dyspnea and wheezing.  Gastrointestinal: Denied, gastro-esophageal reflux, melena, nausea and vomiting.  Genitourinary: Denied genitourinary symptoms including symptomatic vaginal discharge, pelvic relaxation issues, and urinary complaints.  Musculoskeletal: Denied musculoskeletal symptoms, stiffness, swelling, muscle weakness and myalgia.  Dermatologic: Denied dermatology symptoms, rash and scar.  Neurologic: Denied neurology symptoms, dizziness, headache, neck pain and syncope.  Psychiatric: Denied psychiatric symptoms, anxiety and depression.  Endocrine: Denied endocrine symptoms including hot flashes and night sweats.   Meds:   Current Outpatient Medications on File Prior to Visit  Medication Sig Dispense Refill   co-enzyme Q-10 30 MG capsule Take 150 mg by mouth daily.     Cyanocobalamin (VITAMIN B 12 PO) Take by mouth.     magnesium oxide (MAG-OX) 400 (240 Mg) MG tablet Take 400 mg by mouth daily.     Prenatal Vit-Fe Fumarate-FA (MULTIVITAMIN-PRENATAL) 27-0.8 MG TABS tablet Take 1 tablet by mouth daily at 12 noon.     Pyridoxine HCl (B-6 PO) Take by mouth.     Riboflavin 100 MG CAPS Take 400 mg by mouth at bedtime.     No current  facility-administered medications on file prior to  visit.     Objective:     Vitals:   09/10/23 0927  BP: 121/86  Pulse: 92    Filed Weights   09/10/23 0927  Weight: 221 lb 1.6 oz (100.3 kg)              Physical examination General NAD, Conversant  HEENT Atraumatic; Op clear with mmm.  Normo-cephalic.  Anicteric sclerae  Thyroid/Neck Smooth without nodularity or enlargement. Normal ROM.  Neck Supple.  Skin No rashes, lesions or ulceration. Normal palpated skin turgor. No nodularity.  Breasts: No masses or discharge.  Symmetric.  No axillary adenopathy.  Lungs: Clear to auscultation.No rales or wheezes. Normal Respiratory effort, no retractions.  Heart: NSR.  No murmurs or rubs appreciated. No peripheral edema  Abdomen: Soft.  Non-tender.  No masses.  No HSM. No hernia  Extremities: Moves all appropriately.  Normal ROM for age. No lymphadenopathy.  Neuro: Oriented to PPT.  Normal mood. Normal affect.     Pelvic: Deferred by patient    Assessment:    G1P0001 Patient Active Problem List   Diagnosis Date Noted   Postpartum care following cesarean delivery 07/06/2022   Encounter for care or examination of lactating mother 07/06/2022   Gestational hypertension 06/18/2022   Elevated blood pressure affecting pregnancy in third trimester, antepartum 06/14/2022   GDM (gestational diabetes mellitus) 05/01/2022   Abnormal glucose tolerance in pregnancy 04/24/2022   Family history of breast cancer 03/11/2022   Anxiety with depression 10/25/2019   Chronic migraine without aura without status migrainosus, not intractable 10/25/2019   Irritable bowel syndrome with constipation 10/25/2019   Psoriasis 12/23/2017     1. Well woman exam with routine gynecological exam   2. Encounter for other general counseling or advice on contraception        Plan:            1.  Basic Screening Recommendations The basic screening recommendations for asymptomatic women were discussed with  the patient during her visit.  The age-appropriate recommendations were discussed with her and the rational for the tests reviewed.  When I am informed by the patient that another primary care physician has previously obtained the age-appropriate tests and they are up-to-date, only outstanding tests are ordered and referrals given as necessary.  Abnormal results of tests will be discussed with her when all of her results are completed.  Routine preventative health maintenance measures emphasized: Exercise/Diet/Weight control, Tobacco Warnings, Alcohol/Substance use risks and Stress Management Pap due next year 2.  She would like to change progesterone only pills to combination pills.  OCPs The risks /benefits of OCPs have been explained to the patient in detail.  Product literature has been given to her where appropriate.  I have instructed her in the use of OCPs.  I have explained to the patient that OCPs are not as effective for birth control during the first month of use, and that another form of contraception should be used during this time.  Both first-day start and Sunday start have been explained.  The risks and benefits of each was discussed.  She has been made aware of  the fact that in rare circumstances, other medications may affect the efficacy of OCPs.  I have answered all of her questions, and I believe that she has an understanding of the effectiveness and use of OCPs. Will start Seasonique.  Orders No orders of the defined types were placed in this encounter.    Meds ordered this encounter  Medications  DISCONTD: norethindrone (MICRONOR) 0.35 MG tablet    Sig: Take 1 tablet (0.35 mg total) by mouth daily.    Dispense:  84 tablet    Refill:  3   Levonorgestrel-Ethinyl Estradiol (AMETHIA) 0.15-0.03 &0.01 MG tablet    Sig: Take 1 tablet by mouth at bedtime.    Dispense:  84 tablet    Refill:  3           F/U  Return in about 1 year (around 09/09/2024) for Annual  Physical.  Elonda Husky, M.D. 09/10/2023 10:13 AM

## 2023-09-10 NOTE — Progress Notes (Signed)
Patients presents for annual exam today. She states continuing to take Micronor daily, still nursing her 26 month old 2-3 times daily. Up to date on pap smear. She states no other questions or concerns at this time.

## 2024-08-20 ENCOUNTER — Encounter
Admission: RE | Disposition: A | Discharge status: Home / Self Care | Payer: Self-pay | Source: Home / Self Care | Attending: Gastroenterology

## 2024-08-20 ENCOUNTER — Encounter: Payer: Self-pay | Admitting: Gastroenterology

## 2024-08-20 ENCOUNTER — Ambulatory Visit
Admission: RE | Admit: 2024-08-20 | Discharge: 2024-08-20 | Disposition: A | Discharge status: Home / Self Care | Attending: Gastroenterology | Admitting: Gastroenterology

## 2024-08-20 ENCOUNTER — Ambulatory Visit: Admitting: Anesthesiology

## 2024-08-20 ENCOUNTER — Other Ambulatory Visit: Payer: Self-pay

## 2024-08-20 DIAGNOSIS — K529 Noninfective gastroenteritis and colitis, unspecified: Secondary | ICD-10-CM | POA: Diagnosis present

## 2024-08-20 DIAGNOSIS — E119 Type 2 diabetes mellitus without complications: Secondary | ICD-10-CM | POA: Insufficient documentation

## 2024-08-20 DIAGNOSIS — I10 Essential (primary) hypertension: Secondary | ICD-10-CM | POA: Diagnosis not present

## 2024-08-20 HISTORY — PX: COLONOSCOPY: SHX5424

## 2024-08-20 LAB — POCT PREGNANCY, URINE: Preg Test, Ur: NEGATIVE

## 2024-08-20 SURGERY — COLONOSCOPY
Anesthesia: General

## 2024-08-20 MED ORDER — DEXMEDETOMIDINE HCL IN NACL 80 MCG/20ML IV SOLN
INTRAVENOUS | Status: DC | PRN
Start: 2024-08-20 — End: 2024-08-20
  Administered 2024-08-20: 8 ug via INTRAVENOUS

## 2024-08-20 MED ORDER — SODIUM CHLORIDE 0.9 % IV SOLN: INTRAVENOUS | Status: DC

## 2024-08-20 MED ORDER — PROPOFOL 10 MG/ML IV BOLUS
INTRAVENOUS | Status: DC | PRN
  Administered 2024-08-20: 30 mg via INTRAVENOUS
  Administered 2024-08-20: 100 ug/kg/min via INTRAVENOUS
  Administered 2024-08-20: 20 mg via INTRAVENOUS

## 2024-08-20 NOTE — Op Note (Signed)
 [facility] Patient Name: Audrey Clarke Procedure Date: 08/20/2024 8:48 AM MRN: [id number] Account #: [id number] Date of Birth: October 04, 1991 Admit Type: Outpatient Age: 33 Room: [facility] ENDO ROOM 4 Gender: Female Note Status: Finalized Instrument Name: Colon Scope [id number] Procedure:             Colonoscopy Indications:           Chronic diarrhea Providers:             Ruel Kung MD, MD Referring MD:          No Local Md, MD (Referring MD) Medicines:             Monitored Anesthesia Care Complications:         No immediate complications. Procedure:             Pre-Anesthesia Assessment:                        - Prior to the procedure, a History and Physical was                         performed, and patient medications, allergies and                         sensitivities were reviewed. The patient's tolerance                         of previous anesthesia was reviewed.                        - The risks and benefits of the procedure and the                         sedation options and risks were discussed with the                         patient. All questions were answered and informed                         consent was obtained.                        - After reviewing the risks and benefits, the patient                         was deemed in satisfactory condition to undergo the                         procedure.                        - ASA Grade Assessment: II - A patient with mild                         systemic disease.                        After obtaining informed consent, the colonoscope was                         passed under direct vision. Throughout the  procedure,                         the patient's blood pressure, pulse, and oxygen                         saturations were monitored continuously. The                         Colonoscope was introduced through the anus and                         advanced to the the cecum,  identified by the                         appendiceal orifice. The colonoscopy was performed                         without difficulty. The patient tolerated the                         procedure well. The quality of the bowel preparation                         was excellent. The ileocecal valve, appendiceal                         orifice, and rectum were photographed. Findings:      The perianal and digital rectal examinations were normal.      The terminal ileum appeared normal. Biopsies were taken with a cold       forceps for histology.      The colon (entire examined portion) appeared normal. Biopsies were taken       with a cold forceps for histology.      The exam was otherwise without abnormality on direct and retroflexion       views. Impression:            - The examined portion of the ileum was normal.                         Biopsied.                        - The entire examined colon is normal. Biopsied.                        - The examination was otherwise normal on direct and                         retroflexion views. Recommendation:        - Discharge patient to home (with escort).                        - Resume previous diet.                        - Continue present medications.                        - Await pathology results.                        -  Return to GI office as previously scheduled. Procedure Code(s):     --- Professional ---                        [phone], Colonoscopy, flexible; with biopsy, single or                         multiple Diagnosis Code(s):     --- Professional ---                        K52.9, Noninfective gastroenteritis and colitis,                         unspecified CPT copyright 2022 American Medical Association. All rights reserved. The codes documented in this report are preliminary and upon coder review may  be revised to meet current compliance requirements. Ruel Kung, MD Ruel Kung MD, MD 08/20/2024 9:12:06 AM This  report has been signed electronically. Number of Addenda: 0 Note Initiated On: 08/20/2024 8:48 AM Scope Withdrawal Time: 0 hours 10 minutes 43 seconds  Total Procedure Duration: 0 hours 12 minutes 45 seconds  Estimated Blood Loss:  Estimated blood loss: none.      [employer] [facility] [employer]

## 2024-08-20 NOTE — H&P (Signed)
 Ruel Kung , MD [address], [address], [place], [place], [address] Phone: [phone] Fax: [phone]  Primary Care Physician:  Steva Clotilda DEL, NP   Pre-Procedure History & Physical: HPI:  Audrey Clarke is a 33 y.o. female is here for an colonoscopy.   Past Medical History:  Diagnosis Date   Anemia    BRCA negative 07/2019   MyRisk neg   Cesarean delivery delivered 07/06/2022   Depression    Family history of breast cancer    Gestational diabetes    History of PCOS    Hypertension    GESTATIONAL   IBS (irritable bowel syndrome)    Increased risk of breast cancer 07/2019   IBIS=27.7%/riskscore=22.9%   Obesity affecting pregnancy 12/27/2021   PCOS (polycystic ovarian syndrome)    Vitamin B12 deficiency    Vitamin D deficiency     Past Surgical History:  Procedure Laterality Date   CESAREAN SECTION N/A 07/03/2022   Procedure: CESAREAN SECTION;  Surgeon: Janit Alm Agent, MD;  Location: [facility] ORS;  Service: Obstetrics;  Laterality: N/A;   TONSILLECTOMY AND ADENOIDECTOMY     WISDOM TOOTH EXTRACTION      Prior to Admission medications   Medication Sig Start Date End Date Taking? Authorizing Provider  co-enzyme Q-10 30 MG capsule Take 150 mg by mouth daily.   Yes [provider]  Cyanocobalamin (VITAMIN B 12 PO) Take by mouth.   Yes [provider]  Levonorgestrel-Ethinyl Estradiol (AMETHIA) 0.15-0.03 &0.01 MG tablet Take 1 tablet by mouth at bedtime. 09/10/23  Yes Janit Alm Agent, MD  Pyridoxine HCl (B-6 PO) Take by mouth.   Yes [provider]  Riboflavin 100 MG CAPS Take 400 mg by mouth at bedtime.   Yes [provider]  magnesium  oxide (MAG-OX) 400 (240 Mg) MG tablet Take 400 mg by mouth daily.    [provider]  Prenatal Vit-Fe Fumarate-FA (MULTIVITAMIN-PRENATAL) 27-0.8 MG TABS tablet  Take 1 tablet by mouth daily at 12 noon.    [provider]    Allergies as of 08/11/2024 - Review Complete 09/10/2023  Allergen Reaction Noted   Sertraline  Other (See Comments) 03/08/2021   Augmentin [amoxicillin-pot clavulanate] Diarrhea and Nausea And Vomiting 07/20/2019    Family History  Problem Relation Age of Onset   Asthma Mother    Hypertension Mother    Osteoarthritis Mother    Breast cancer Mother 43   Hypertension Father    Breast cancer Maternal Grandmother 18   Diabetes Maternal Grandfather    Prostate cancer Maternal Grandfather    Diabetes Paternal Grandfather    Hypertension Paternal Grandfather    Alzheimer's disease Paternal Grandfather    Hypertension Maternal Aunt    Diabetes Maternal Aunt    Breast cancer Maternal Aunt 64    Social History   Socioeconomic History   Marital status: Legally Separated    Spouse name: Chimamanda Siegfried   Number of children: Not on file   Years of education: Not on file   Highest education level: Not on file  Occupational History   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  Tobacco Use   Smoking status: Never   Smokeless tobacco: Never  Vaping Use   Vaping status: Never Used  Substance and Sexual Activity   Alcohol use: Never   Drug use: Never   Sexual activity: Yes    Birth control/protection: Pill  Other Topics Concern   Not on file  Social History Narrative  Not on file   Social Drivers of Health   Financial Resource Strain: Medium Risk (07/21/2024)   Received from [facility]   Overall Financial Resource Strain (CARDIA)    Difficulty of Paying Living Expenses: Somewhat hard  Food Insecurity: No Food Insecurity (07/21/2024)   Received from [facility]   Hunger Vital Sign    Within the past 12 months, you worried that your food would run out before you got the money to buy more.: Never true    Within the past 12 months, the food you bought just didn't  last and you didn't have money to get more.: Never true  Transportation Needs: No Transportation Needs (07/21/2024)   Received from [facility] - Transportation    In the past 12 months, has lack of transportation kept you from medical appointments or from getting medications?: No    Lack of Transportation (Non-Medical): No  Physical Activity: Inactive (07/21/2024)   Received from [facility]   Exercise Vital Sign    On average, how many days per week do you engage in moderate to strenuous exercise (like a brisk walk)?: 0 days    On average, how many minutes do you engage in exercise at this level?: 0 min  Stress: Stress Concern Present (07/21/2024)   Received from [facility] - Occupational Stress Questionnaire    Feeling of Stress : To some extent  Social Connections: Moderately Integrated (07/21/2024)   Received from [facility]   Social Connection and Isolation Panel    In a typical week, how many times do you talk on the phone with family, friends, or neighbors?: More than three times a week    How often do you get together with friends or relatives?: More than three times a week    How often do you attend church or religious services?: More than 4 times per year    Do you belong to any clubs or organizations such as church groups, unions, fraternal or athletic groups, or school groups?: Yes    How often do you attend meetings of the clubs or organizations you belong to?: More than 4 times per year    Are you married, widowed, divorced, separated, never married, or living with a partner?: Divorced  Intimate Partner Violence: Not At Risk (07/03/2022)   Humiliation, Afraid, Rape, and Kick questionnaire    Fear of Current or Ex-Partner: No    Emotionally Abused: No    Physically Abused: No    Sexually Abused: No    Review of Systems: See HPI, otherwise negative  ROS  Physical Exam: There were no vitals taken for this visit. General:   Alert,  pleasant and cooperative in NAD Head:  Normocephalic and atraumatic. Neck:  Supple; no masses or thyromegaly. Lungs:  Clear throughout to auscultation, normal respiratory effort.    Heart:  +S1, +S2, Regular rate and rhythm, No edema. Abdomen:  Soft, nontender and nondistended. Normal bowel sounds, without guarding, and without rebound.   Neurologic:  Alert and  oriented x4;  grossly normal neurologically.  Impression/Plan: Noreene Taylar Hartsough is here for an colonoscopy to be performed for chronic diarrhea   Risks, benefits, limitations, and alternatives regarding  colonoscopy have been reviewed with the patient.  Questions have been answered.  All parties agreeable.   Ruel Kung, MD  08/20/2024, 8:17 AM

## 2024-08-20 NOTE — Anesthesia Postprocedure Evaluation (Signed)
 Anesthesia Post Note  Patient: Audrey Clarke  Procedure(s) Performed: COLONOSCOPY  Patient location during evaluation: Endoscopy Anesthesia Type: General Level of consciousness: awake and alert Pain management: pain level controlled Vital Signs Assessment: post-procedure vital signs reviewed and stable Respiratory status: spontaneous breathing, nonlabored ventilation, respiratory function stable and patient connected to nasal cannula oxygen Cardiovascular status: blood pressure returned to baseline and stable Postop Assessment: no apparent nausea or vomiting Anesthetic complications: no   No notable events documented.   Last Vitals:  Vitals:   08/20/24 0914 08/20/24 0916  BP: 109/80 109/80  Pulse: 80 80  Resp: 14 16  Temp: (!) 35.9 C   SpO2: 97% 97%    Last Pain:  Vitals:   08/20/24 0916  TempSrc: Oral  PainSc: 0-No pain                 Lendia LITTIE Mae

## 2024-08-20 NOTE — Transfer of Care (Signed)
 Immediate Anesthesia Transfer of Care Note  Patient: Audrey Clarke  Procedure(s) Performed: COLONOSCOPY  Patient Location: Endoscopy Unit  Anesthesia Type:General  Level of Consciousness: drowsy and patient cooperative  Airway & Oxygen Therapy: Patient Spontanous Breathing  Post-op Assessment: Report given to RN and Post -op Vital signs reviewed and stable  Post vital signs: Reviewed and stable  Last Vitals:  Vitals Value Taken Time  BP 109/80 08/20/24 09:14  Temp 35.9 C 08/20/24 09:14  Pulse 80 08/20/24 09:14  Resp 14 08/20/24 09:14  SpO2      Last Pain:  Vitals:   08/20/24 0914  TempSrc: Temporal  PainSc: Asleep         Complications: No notable events documented.

## 2024-08-20 NOTE — Anesthesia Preprocedure Evaluation (Addendum)
 Anesthesia Evaluation  Patient identified by MRN, date of birth, ID band Patient awake    Reviewed: Allergy & Precautions, NPO status , Patient's Chart, lab work & pertinent test results  History of Anesthesia Complications Negative for: history of anesthetic complications  Airway Mallampati: I  TM Distance: >3 FB Neck ROM: full    Dental no notable dental hx.    Pulmonary neg pulmonary ROS   Pulmonary exam normal        Cardiovascular hypertension, Normal cardiovascular exam     Neuro/Psych  Headaches  negative psych ROS   GI/Hepatic negative GI ROS, Neg liver ROS,,,  Endo/Other  diabetes    Renal/GU negative Renal ROS  negative genitourinary   Musculoskeletal   Abdominal   Peds  Hematology  (+) Blood dyscrasia, anemia   Anesthesia Other Findings Past Medical History: No date: Anemia 07/2019: BRCA negative     Comment:  MyRisk neg 07/06/2022: Cesarean delivery delivered No date: Depression No date: Family history of breast cancer No date: Gestational diabetes No date: History of PCOS No date: Hypertension     Comment:  GESTATIONAL No date: IBS (irritable bowel syndrome) 07/2019: Increased risk of breast cancer     Comment:  IBIS=27.7%/riskscore=22.9% 12/27/2021: Obesity affecting pregnancy No date: PCOS (polycystic ovarian syndrome) No date: Vitamin B12 deficiency No date: Vitamin D deficiency  Past Surgical History: 07/03/2022: CESAREAN SECTION; N/A     Comment:  Procedure: CESAREAN SECTION;  Surgeon: Janit Alm Agent, MD;  Location: [facility] ORS;  Service: Obstetrics;                Laterality: N/A; No date: TONSILLECTOMY AND ADENOIDECTOMY No date: WISDOM TOOTH EXTRACTION  BMI    Body Mass Index: 36.28 kg/m      Reproductive/Obstetrics negative OB ROS                              Anesthesia Physical Anesthesia Plan  ASA: 2  Anesthesia Plan: General    Post-op Pain Management: Minimal or no pain anticipated   Induction: Intravenous  PONV Risk Score and Plan: 2 and Propofol infusion and TIVA  Airway Management Planned: Natural Airway and Nasal Cannula  Additional Equipment:   Intra-op Plan:   Post-operative Plan:   Informed Consent: I have reviewed the patients History and Physical, chart, labs and discussed the procedure including the risks, benefits and alternatives for the proposed anesthesia with the patient or authorized representative who has indicated his/her understanding and acceptance.     Dental Advisory Given  Plan Discussed with: Anesthesiologist, CRNA and Surgeon  Anesthesia Plan Comments: (Patient consented for risks of anesthesia including but not limited to:  - adverse reactions to medications - risk of airway placement if required - damage to eyes, teeth, lips or other oral mucosa - nerve damage due to positioning  - sore throat or hoarseness - Damage to heart, brain, nerves, lungs, other parts of body or loss of life  Patient voiced understanding and assent.)         Anesthesia Quick Evaluation

## 2024-08-20 NOTE — Anesthesia Procedure Notes (Signed)
 Procedure Name: MAC Date/Time: 08/20/2024 8:50 AM  Performed by: Lorrene Camelia LABOR, CRNAPre-anesthesia Checklist: Patient identified, Emergency Drugs available, Suction available and Patient being monitored Patient Re-evaluated:Patient Re-evaluated prior to induction Oxygen Delivery Method: Simple face mask

## 2024-08-23 LAB — SURGICAL PATHOLOGY

## 2024-09-02 ENCOUNTER — Other Ambulatory Visit: Payer: Self-pay | Admitting: Obstetrics and Gynecology

## 2024-09-02 DIAGNOSIS — Z3009 Encounter for other general counseling and advice on contraception: Secondary | ICD-10-CM

## 2024-09-02 DIAGNOSIS — Z01419 Encounter for gynecological examination (general) (routine) without abnormal findings: Secondary | ICD-10-CM

## 2024-09-03 ENCOUNTER — Other Ambulatory Visit: Payer: Self-pay

## 2024-09-03 DIAGNOSIS — Z3009 Encounter for other general counseling and advice on contraception: Secondary | ICD-10-CM

## 2024-09-03 DIAGNOSIS — Z01419 Encounter for gynecological examination (general) (routine) without abnormal findings: Secondary | ICD-10-CM

## 2024-09-03 MED ORDER — LEVONORGEST-ETH ESTRAD 91-DAY 0.15-0.03 &0.01 MG PO TABS: 1.0000 | ORAL_TABLET | Freq: Every day | ORAL | 0 refills | Status: DC

## 2024-09-22 ENCOUNTER — Ambulatory Visit: Admitting: Obstetrics & Gynecology

## 2024-12-01 ENCOUNTER — Ambulatory Visit: Admitting: Obstetrics

## 2024-12-01 ENCOUNTER — Telehealth: Payer: Self-pay

## 2024-12-01 DIAGNOSIS — Z124 Encounter for screening for malignant neoplasm of cervix: Secondary | ICD-10-CM

## 2024-12-01 DIAGNOSIS — Z01419 Encounter for gynecological examination (general) (routine) without abnormal findings: Secondary | ICD-10-CM

## 2024-12-01 NOTE — Telephone Encounter (Signed)
 Patient walked in office today requesting refill on her birth control Amethia. Patient has scheduled her annual physical for 01/12/25 with Lauraine [facility]. Patient had an appointment scheduled initially with Dr. Starla today but cancelled due to inactive insurance coverage. Patient was last seen in clinic for her annual on 09/10/23, patients medication was last filled on 09/03/24. Patient is requesting that physician refill prescription to last until her appt date. KW

## 2024-12-02 ENCOUNTER — Other Ambulatory Visit: Payer: Self-pay | Admitting: Obstetrics & Gynecology

## 2024-12-02 DIAGNOSIS — Z3009 Encounter for other general counseling and advice on contraception: Secondary | ICD-10-CM

## 2024-12-02 DIAGNOSIS — Z01419 Encounter for gynecological examination (general) (routine) without abnormal findings: Secondary | ICD-10-CM

## 2024-12-03 ENCOUNTER — Other Ambulatory Visit: Payer: Self-pay

## 2024-12-03 DIAGNOSIS — Z3009 Encounter for other general counseling and advice on contraception: Secondary | ICD-10-CM

## 2024-12-03 DIAGNOSIS — Z01419 Encounter for gynecological examination (general) (routine) without abnormal findings: Secondary | ICD-10-CM

## 2024-12-03 MED ORDER — LEVONORGEST-ETH ESTRAD 91-DAY 0.15-0.03 &0.01 MG PO TABS: 1.0000 | ORAL_TABLET | Freq: Every day | ORAL | 4 refills | Status: AC

## 2025-01-12 ENCOUNTER — Ambulatory Visit: Admitting: Registered Nurse
